# Patient Record
Sex: Male | Born: 1955 | Race: Black or African American | Hispanic: No | Smoking: Never smoker
Health system: Southern US, Community
[De-identification: ages and names within clinical notes are randomized; demographics above are authoritative.]

## PROBLEM LIST (undated history)

## (undated) DIAGNOSIS — Z972 Presence of dental prosthetic device (complete) (partial): Secondary | ICD-10-CM

## (undated) DIAGNOSIS — J45909 Unspecified asthma, uncomplicated: Secondary | ICD-10-CM

## (undated) DIAGNOSIS — I10 Essential (primary) hypertension: Secondary | ICD-10-CM

## (undated) HISTORY — PX: TRABECULECTOMY: SHX107

## (undated) HISTORY — PX: UPPER GI ENDOSCOPY: SHX6162

## (undated) HISTORY — DX: Essential (primary) hypertension: I10

---

## 2006-01-23 ENCOUNTER — Emergency Department: Payer: Self-pay | Admitting: Emergency Medicine

## 2006-01-23 ENCOUNTER — Other Ambulatory Visit: Payer: Self-pay

## 2012-05-10 HISTORY — PX: SHOULDER ARTHROSCOPY W/ ROTATOR CUFF REPAIR: SHX2400

## 2017-04-28 DIAGNOSIS — Z79899 Other long term (current) drug therapy: Secondary | ICD-10-CM | POA: Diagnosis not present

## 2017-04-28 DIAGNOSIS — R42 Dizziness and giddiness: Secondary | ICD-10-CM | POA: Diagnosis not present

## 2017-04-28 DIAGNOSIS — R471 Dysarthria and anarthria: Secondary | ICD-10-CM | POA: Diagnosis not present

## 2017-04-28 DIAGNOSIS — R55 Syncope and collapse: Secondary | ICD-10-CM | POA: Diagnosis not present

## 2017-04-28 DIAGNOSIS — R51 Headache: Secondary | ICD-10-CM | POA: Diagnosis not present

## 2017-04-28 DIAGNOSIS — R479 Unspecified speech disturbances: Secondary | ICD-10-CM | POA: Diagnosis not present

## 2017-04-28 DIAGNOSIS — R419 Unspecified symptoms and signs involving cognitive functions and awareness: Secondary | ICD-10-CM | POA: Diagnosis not present

## 2017-04-28 DIAGNOSIS — I10 Essential (primary) hypertension: Secondary | ICD-10-CM | POA: Diagnosis not present

## 2017-04-28 DIAGNOSIS — R079 Chest pain, unspecified: Secondary | ICD-10-CM | POA: Diagnosis not present

## 2017-04-28 DIAGNOSIS — G459 Transient cerebral ischemic attack, unspecified: Secondary | ICD-10-CM | POA: Diagnosis not present

## 2017-04-28 DIAGNOSIS — R4182 Altered mental status, unspecified: Secondary | ICD-10-CM | POA: Diagnosis not present

## 2017-04-28 DIAGNOSIS — R404 Transient alteration of awareness: Secondary | ICD-10-CM | POA: Diagnosis not present

## 2017-05-24 DIAGNOSIS — H40153 Residual stage of open-angle glaucoma, bilateral: Secondary | ICD-10-CM | POA: Diagnosis not present

## 2017-05-25 ENCOUNTER — Ambulatory Visit (INDEPENDENT_AMBULATORY_CARE_PROVIDER_SITE_OTHER): Payer: BLUE CROSS/BLUE SHIELD | Admitting: Family Medicine

## 2017-05-25 ENCOUNTER — Encounter: Payer: Self-pay | Admitting: Family Medicine

## 2017-05-25 ENCOUNTER — Other Ambulatory Visit: Payer: Self-pay

## 2017-05-25 VITALS — BP 140/82 | HR 89 | Resp 16 | Ht 68.0 in | Wt 189.4 lb

## 2017-05-25 DIAGNOSIS — R079 Chest pain, unspecified: Secondary | ICD-10-CM | POA: Diagnosis not present

## 2017-05-25 DIAGNOSIS — Z833 Family history of diabetes mellitus: Secondary | ICD-10-CM

## 2017-05-25 DIAGNOSIS — M4722 Other spondylosis with radiculopathy, cervical region: Secondary | ICD-10-CM

## 2017-05-25 DIAGNOSIS — I10 Essential (primary) hypertension: Secondary | ICD-10-CM | POA: Diagnosis not present

## 2017-05-25 DIAGNOSIS — K429 Umbilical hernia without obstruction or gangrene: Secondary | ICD-10-CM | POA: Insufficient documentation

## 2017-05-25 DIAGNOSIS — Z1211 Encounter for screening for malignant neoplasm of colon: Secondary | ICD-10-CM

## 2017-05-25 DIAGNOSIS — K439 Ventral hernia without obstruction or gangrene: Secondary | ICD-10-CM

## 2017-05-25 DIAGNOSIS — M5412 Radiculopathy, cervical region: Secondary | ICD-10-CM | POA: Insufficient documentation

## 2017-05-26 LAB — CBC
HEMATOCRIT: 47.6 % (ref 37.5–51.0)
Hemoglobin: 15.2 g/dL (ref 13.0–17.7)
MCH: 30.3 pg (ref 26.6–33.0)
MCHC: 31.9 g/dL (ref 31.5–35.7)
MCV: 95 fL (ref 79–97)
Platelets: 221 10*3/uL (ref 150–379)
RBC: 5.02 x10E6/uL (ref 4.14–5.80)
RDW: 12.2 % — AB (ref 12.3–15.4)
WBC: 5.7 10*3/uL (ref 3.4–10.8)

## 2017-05-26 LAB — COMPREHENSIVE METABOLIC PANEL
ALBUMIN: 4.6 g/dL (ref 3.6–4.8)
ALK PHOS: 77 IU/L (ref 39–117)
ALT: 24 IU/L (ref 0–44)
AST: 22 IU/L (ref 0–40)
Albumin/Globulin Ratio: 1.5 (ref 1.2–2.2)
BUN / CREAT RATIO: 15 (ref 10–24)
BUN: 21 mg/dL (ref 8–27)
Bilirubin Total: 0.5 mg/dL (ref 0.0–1.2)
CALCIUM: 9.4 mg/dL (ref 8.6–10.2)
CO2: 24 mmol/L (ref 20–29)
CREATININE: 1.36 mg/dL — AB (ref 0.76–1.27)
Chloride: 100 mmol/L (ref 96–106)
GFR calc Af Amer: 64 mL/min/{1.73_m2} (ref >59)
GFR, EST NON AFRICAN AMERICAN: 56 mL/min/{1.73_m2} — AB (ref >59)
GLUCOSE: 90 mg/dL (ref 65–99)
Globulin, Total: 3 g/dL (ref 1.5–4.5)
Potassium: 4.3 mmol/L (ref 3.5–5.2)
Sodium: 138 mmol/L (ref 134–144)
TOTAL PROTEIN: 7.6 g/dL (ref 6.0–8.5)

## 2017-05-26 LAB — LIPID PANEL
CHOL/HDL RATIO: 3.5 ratio (ref 0.0–5.0)
Cholesterol, Total: 149 mg/dL (ref 100–199)
HDL: 42 mg/dL (ref >39)
LDL Calculated: 85 mg/dL (ref 0–99)
TRIGLYCERIDES: 110 mg/dL (ref 0–149)
VLDL Cholesterol Cal: 22 mg/dL (ref 5–40)

## 2017-05-26 LAB — HEMOGLOBIN A1C
ESTIMATED AVERAGE GLUCOSE: 85 mg/dL
HEMOGLOBIN A1C: 4.6 % — AB (ref 4.8–5.6)

## 2017-05-26 LAB — TSH: TSH: 1.38 u[IU]/mL (ref 0.450–4.500)

## 2017-05-26 LAB — VITAMIN B12: Vitamin B-12: 597 pg/mL (ref 232–1245)

## 2017-05-26 NOTE — Progress Notes (Addendum)
Date:  05/25/2017   Name:  Jason [name]   DOB:  20-Nov-1955   MRN:  [id number]  PCP:  Schuyler Amor, MD    Chief Complaint: Establish Care   History of Present Illness:  This is a 62 y.o. male seen for initial visit. HTN on valsartan/HCTZ x yrs, went off recently and ended up in ED with elevated BP and MS changes, w/u negative, has felt ok since med restarted except does have intermittent exertional chest tightness. EKG in ED showed possible LVH and lateral ischemia. Also c/o RLQ abd pain with bending over or Valsalva past 2 years, getting slowly worse. Also c/o intermittent neck pain and BUE numbness, neck CT in ED showed multilevel cervical spondylosis. Denies hx surgery or hospitalization, taking asa qod for prevention. Father died prostate ca 67, mother alive 82 with IDDM, sister ok. Tet imm 3 yrs ago, declines flu imm, never had colonoscopy.  Review of Systems:  Review of Systems  Constitutional: Negative for chills and fever.  HENT: Negative for sinus pain and trouble swallowing.   Respiratory: Negative for cough and shortness of breath.   Cardiovascular: Negative for leg swelling.  Genitourinary: Negative for difficulty urinating and urgency.  Neurological: Negative for syncope and light-headedness.    Patient Active Problem List   Diagnosis Date Noted  . Hypertension 05/25/2017  . Ventral hernia 05/25/2017  . Exertional chest pain 05/25/2017  . Cervical radiculopathy 05/25/2017  . FH: diabetes mellitus 05/25/2017    Prior to Admission medications   Medication Sig Start Date End Date Taking? Authorizing Provider  aspirin 81 MG tablet Take 81 mg by mouth daily.   Yes [provider]  valsartan-hydrochlorothiazide (DIOVAN-HCT) 80-12.5 MG tablet  05/24/17  Yes [provider]    No Known Allergies  History reviewed. No pertinent surgical history.  Social History   Tobacco Use  . Smoking status: Never Smoker  . Smokeless tobacco: Never Used  Substance  Use Topics  . Alcohol use: No    Frequency: Never  . Drug use: No    Family History  Problem Relation Age of Onset  . Diabetes Mother   . Prostatitis Father   . Prostatitis Paternal Aunt     Medication list has been reviewed and updated.  Physical Examination: BP 140/82   Pulse 89   Resp 16   Ht 5\' 8"  (1.727 m)   Wt 189 lb 6.4 oz (85.9 kg)   SpO2 98%   BMI 28.80 kg/m   Physical Exam  Constitutional: He is oriented to person, place, and time. He appears well-developed and well-nourished.  HENT:  Head: Normocephalic and atraumatic.  Right Ear: External ear normal.  Left Ear: External ear normal.  Nose: Nose normal.  Mouth/Throat: Oropharynx is clear and moist.  Eyes: Conjunctivae and EOM are normal. Pupils are equal, round, and reactive to light.  Neck: Neck supple. No thyromegaly present.  Cardiovascular: Normal rate, regular rhythm and normal heart sounds.  Pulmonary/Chest: Effort normal and breath sounds normal.  Abdominal: Soft. He exhibits no distension.  Mildly tender bulge RLQ with Valsalva appreciated  Musculoskeletal: He exhibits no edema.  Lymphadenopathy:    He has no cervical adenopathy.  Neurological: He is alert and oriented to person, place, and time. Coordination normal.  Skin: Skin is warm and dry.  Psychiatric: He has a normal mood and affect. His behavior is normal.  Nursing note and vitals reviewed.   Assessment and Plan:  1. Essential hypertension Adequate control back on valsartan/HCTZ,  refill - Comprehensive Metabolic Panel (CMET) - CBC - TSH - Lipid Profile  2. Exertional chest pain With abnormal EKG in ED, add asa 81 mg daily - Ambulatory referral to Cardiology  3. Osteoarthritis of spine with radiculopathy, cervical region - B12 - Ambulatory referral to Physical Therapy  4. Ventral hernia without obstruction or gangrene Discussed possible surgical referral, will defer until other issues resolved  5. Colon cancer screening -  Ambulatory referral to Gastroenterology  6. FH: diabetes mellitus - HgB A1c  7. HM Consider zoster imm, PSA/HIV/hep C next visit  Return in about 4 weeks (around 06/22/2017).   45 mins spent with patient over half in counselint  Caris Cerveny M. Kingsley SpittlePlonk, Jr. MD [facility]  05/26/2017

## 2017-06-22 ENCOUNTER — Ambulatory Visit: Payer: BLUE CROSS/BLUE SHIELD | Admitting: Family Medicine

## 2017-06-24 ENCOUNTER — Other Ambulatory Visit: Payer: Self-pay

## 2017-08-04 ENCOUNTER — Other Ambulatory Visit: Payer: Self-pay

## 2017-08-04 MED ORDER — VALSARTAN-HYDROCHLOROTHIAZIDE 80-12.5 MG PO TABS: 1.0000 | ORAL_TABLET | Freq: Every day | ORAL | 3 refills | Status: DC

## 2017-09-02 ENCOUNTER — Encounter: Payer: Self-pay | Admitting: Family Medicine

## 2017-09-02 ENCOUNTER — Ambulatory Visit (INDEPENDENT_AMBULATORY_CARE_PROVIDER_SITE_OTHER): Payer: BLUE CROSS/BLUE SHIELD | Admitting: Family Medicine

## 2017-09-02 VITALS — BP 130/100 | HR 72 | Temp 98.7°F | Ht 68.0 in | Wt 187.0 lb

## 2017-09-02 DIAGNOSIS — J0100 Acute maxillary sinusitis, unspecified: Secondary | ICD-10-CM | POA: Diagnosis not present

## 2017-09-02 MED ORDER — AMOXICILLIN 500 MG PO CAPS: 500.0000 mg | ORAL_CAPSULE | Freq: Three times a day (TID) | ORAL | 0 refills | Status: DC

## 2017-09-02 NOTE — Progress Notes (Signed)
Name: Jason [name]   MRN: [id number]    DOB: 1955/05/13   Date:09/02/2017       Progress Note  Subjective  Chief Complaint  Chief Complaint  Patient presents with  . Sore Throat    worse sore throat he has ever had- got back from [place] on April 4th, cough with green production, bad breath, drainage    Sore Throat   This is a new problem. The current episode started 1 to 4 weeks ago (over 2 weeks). The problem has been gradually worsening. Neither side of throat is experiencing more pain than the other. There has been no fever. The fever has been present for 5 days or more. The pain is at a severity of 8/10. The pain is moderate. Associated symptoms include congestion, coughing, a hoarse voice, neck pain, shortness of breath and stridor. Pertinent negatives include no abdominal pain, diarrhea, drooling, ear discharge, ear pain, headaches, plugged ear sensation, swollen glands, trouble swallowing or vomiting. He has had no exposure to strep or mono. He has tried NSAIDs for the symptoms. The treatment provided mild relief.  Sinusitis  This is a new problem. The current episode started 1 to 4 weeks ago (2 weeks). The problem has been gradually improving since onset. Maximum temperature: low grade. Associated symptoms include congestion, coughing, diaphoresis, a hoarse voice, neck pain, shortness of breath, sinus pressure and a sore throat. Pertinent negatives include no chills, ear pain, headaches, sneezing or swollen glands. Past treatments include oral decongestants. The treatment provided no relief.    No problem-specific Assessment & Plan notes found for this encounter.   Past Medical History:  Diagnosis Date  . Hypertension     No past surgical history on file.  Family History  Problem Relation Age of Onset  . Diabetes Mother   . Prostatitis Father   . Prostatitis Paternal Aunt     Social History   Socioeconomic History  . Marital status: Widowed    Spouse name: Not on file   . Number of children: Not on file  . Years of education: Not on file  . Highest education level: Not on file  Occupational History  . Occupation: employed  Engineer, productionocial Needs  . Financial resource strain: Patient refused  . Food insecurity:    Worry: Patient refused    Inability: Patient refused  . Transportation needs:    Medical: Patient refused    Non-medical: Patient refused  Tobacco Use  . Smoking status: Never Smoker  . Smokeless tobacco: Never Used  Substance and Sexual Activity  . Alcohol use: No    Frequency: Never  . Drug use: No  . Sexual activity: Not on file  Lifestyle  . Physical activity:    Days per week: Patient refused    Minutes per session: Patient refused  . Stress: Not on file  Relationships  . Social connections:    Talks on phone: Patient refused    Gets together: Patient refused    Attends religious service: Patient refused    Active member of club or organization: Patient refused    Attends meetings of clubs or organizations: Patient refused    Relationship status: Patient refused  . Intimate partner violence:    Fear of current or ex partner: Patient refused    Emotionally abused: Patient refused    Physically abused: Patient refused    Forced sexual activity: Patient refused  Other Topics Concern  . Not on file  Social History Narrative  . Not on  file    No Known Allergies  Outpatient Medications Prior to Visit  Medication Sig Dispense Refill  . valsartan-hydrochlorothiazide (DIOVAN-HCT) 80-12.5 MG tablet Take 1 tablet by mouth daily. 90 tablet 3  . aspirin 81 MG tablet Take 81 mg by mouth daily.     No facility-administered medications prior to visit.     Review of Systems  Constitutional: Positive for diaphoresis. Negative for chills, fever, malaise/fatigue and weight loss.  HENT: Positive for congestion, hoarse voice, sinus pressure and sore throat. Negative for drooling, ear discharge, ear pain, sneezing and trouble swallowing.    Eyes: Negative for blurred vision.  Respiratory: Positive for cough, shortness of breath and stridor. Negative for sputum production and wheezing.   Cardiovascular: Negative for chest pain, palpitations and leg swelling.  Gastrointestinal: Negative for abdominal pain, blood in stool, constipation, diarrhea, heartburn, melena, nausea and vomiting.  Genitourinary: Negative for dysuria, frequency, hematuria and urgency.  Musculoskeletal: Positive for neck pain. Negative for back pain, joint pain and myalgias.  Skin: Negative for rash.  Neurological: Negative for dizziness, tingling, sensory change, focal weakness and headaches.  Endo/Heme/Allergies: Negative for environmental allergies and polydipsia. Does not bruise/bleed easily.  Psychiatric/Behavioral: Negative for depression and suicidal ideas. The patient is not nervous/anxious and does not have insomnia.      Objective  Vitals:   09/02/17 1427  BP: (!) 130/100  Pulse: 72  Temp: 98.7 F (37.1 C)  TempSrc: Oral  Weight: 187 lb (84.8 kg)  Height: 5\' 8"  (1.727 m)    Physical Exam  Constitutional: He is oriented to person, place, and time.  HENT:  Head: Normocephalic.  Right Ear: External ear normal.  Left Ear: External ear normal.  Nose: Nose normal.  Mouth/Throat: Oropharynx is clear and moist.  Eyes: Pupils are equal, round, and reactive to light. Conjunctivae and EOM are normal. Right eye exhibits no discharge. Left eye exhibits no discharge. No scleral icterus.  Neck: Normal range of motion. Neck supple. No JVD present. No tracheal deviation present. No thyromegaly present.  Cardiovascular: Normal rate, regular rhythm, normal heart sounds and intact distal pulses. Exam reveals no gallop and no friction rub.  No murmur heard. Pulmonary/Chest: Breath sounds normal. No respiratory distress. He has no wheezes. He has no rales.  Abdominal: Soft. Bowel sounds are normal. He exhibits no mass. There is no hepatosplenomegaly. There  is no tenderness. There is no rebound, no guarding and no CVA tenderness.  Musculoskeletal: Normal range of motion. He exhibits no edema or tenderness.  Lymphadenopathy:    He has no cervical adenopathy.  Neurological: He is alert and oriented to person, place, and time. He has normal strength and normal reflexes. No cranial nerve deficit.  Skin: Skin is warm. No rash noted.  Nursing note and vitals reviewed.     Assessment & Plan  Problem List Items Addressed This Visit    None    Visit Diagnoses    Acute maxillary sinusitis, recurrence not specified    -  Primary   call if cont sx /next step ent   Relevant Medications   amoxicillin (AMOXIL) 500 MG capsule      Meds ordered this encounter  Medications  . amoxicillin (AMOXIL) 500 MG capsule    Sig: Take 1 capsule (500 mg total) by mouth 3 (three) times daily.    Dispense:  30 capsule    Refill:  0      Dr. Elizabeth Sauer [facility]  09/02/17

## 2018-10-06 ENCOUNTER — Other Ambulatory Visit: Payer: Self-pay | Admitting: Family Medicine

## 2018-10-10 ENCOUNTER — Other Ambulatory Visit: Payer: Self-pay

## 2018-10-10 ENCOUNTER — Telehealth: Payer: Self-pay | Admitting: Family Medicine

## 2018-10-10 MED ORDER — VALSARTAN-HYDROCHLOROTHIAZIDE 80-12.5 MG PO TABS: 1.0000 | ORAL_TABLET | Freq: Every day | ORAL | 0 refills | Status: DC

## 2018-10-10 NOTE — Telephone Encounter (Signed)
Need refills til his appointment June 16.  valsartan-hydrochlorothiazide (DIOVAN-HCT) 80-12.5 MG tablet [[id number]]

## 2018-10-24 ENCOUNTER — Other Ambulatory Visit: Payer: Self-pay

## 2018-10-24 ENCOUNTER — Ambulatory Visit (INDEPENDENT_AMBULATORY_CARE_PROVIDER_SITE_OTHER): Payer: BC Managed Care – PPO | Admitting: Family Medicine

## 2018-10-24 ENCOUNTER — Encounter: Payer: Self-pay | Admitting: Family Medicine

## 2018-10-24 VITALS — BP 138/82 | HR 60 | Ht 68.0 in | Wt 179.0 lb

## 2018-10-24 DIAGNOSIS — K4090 Unilateral inguinal hernia, without obstruction or gangrene, not specified as recurrent: Secondary | ICD-10-CM | POA: Diagnosis not present

## 2018-10-24 DIAGNOSIS — E663 Overweight: Secondary | ICD-10-CM

## 2018-10-24 DIAGNOSIS — Z114 Encounter for screening for human immunodeficiency virus [HIV]: Secondary | ICD-10-CM

## 2018-10-24 DIAGNOSIS — Z1159 Encounter for screening for other viral diseases: Secondary | ICD-10-CM

## 2018-10-24 DIAGNOSIS — Z1211 Encounter for screening for malignant neoplasm of colon: Secondary | ICD-10-CM

## 2018-10-24 DIAGNOSIS — B1920 Unspecified viral hepatitis C without hepatic coma: Secondary | ICD-10-CM

## 2018-10-24 DIAGNOSIS — I10 Essential (primary) hypertension: Secondary | ICD-10-CM | POA: Diagnosis not present

## 2018-10-24 LAB — HEMOCCULT GUIAC POC 1CARD (OFFICE): Fecal Occult Blood, POC: NEGATIVE

## 2018-10-24 MED ORDER — VALSARTAN-HYDROCHLOROTHIAZIDE 80-12.5 MG PO TABS: 1.0000 | ORAL_TABLET | Freq: Every day | ORAL | 1 refills | Status: DC

## 2018-10-24 NOTE — Patient Instructions (Signed)

## 2018-10-24 NOTE — Progress Notes (Signed)
Date:  10/24/2018   Name:  Jason [name]   DOB:  Mar 08, 1956   MRN:  [id number]   Chief Complaint: Hypertension and Hernia (has "got bigger in the past 3 months")  Hypertension This is a new problem. The current episode started more than 1 year ago. The problem is unchanged. The problem is controlled. Pertinent negatives include no anxiety, blurred vision, chest pain, headaches, malaise/fatigue, neck pain, orthopnea, palpitations, peripheral edema, PND, shortness of breath or sweats. There are no associated agents to hypertension. There are no known risk factors for coronary artery disease. Past treatments include angiotensin blockers and diuretics. The current treatment provides no improvement. There are no compliance problems.  There is no history of angina, kidney disease, CAD/MI, CVA, heart failure, left ventricular hypertrophy, PVD or retinopathy. There is no history of chronic renal disease, a hypertension causing med or renovascular disease.  Other This is a new (for hernia) problem. The current episode started more than 1 month ago. The problem has been gradually worsening (getting bigger). Pertinent negatives include no abdominal pain, change in bowel habit, chest pain, chills, coughing, fever, headaches, myalgias, nausea, neck pain, rash or sore throat. Nothing aggravates the symptoms. He has tried nothing for the symptoms.    Review of Systems  Constitutional: Negative for chills, fever and malaise/fatigue.  HENT: Negative for drooling, ear discharge, ear pain and sore throat.   Eyes: Negative for blurred vision.  Respiratory: Negative for cough, choking, chest tightness, shortness of breath and wheezing.   Cardiovascular: Negative for chest pain, palpitations, orthopnea, leg swelling and PND.  Gastrointestinal: Negative for abdominal pain, blood in stool, change in bowel habit, constipation, diarrhea and nausea.  Endocrine: Negative for polydipsia.  Genitourinary: Negative for  dysuria, frequency, hematuria and urgency.  Musculoskeletal: Negative for back pain, myalgias and neck pain.  Skin: Negative for rash.  Allergic/Immunologic: Negative for environmental allergies.  Neurological: Negative for dizziness and headaches.  Hematological: Does not bruise/bleed easily.  Psychiatric/Behavioral: Negative for suicidal ideas. The patient is not nervous/anxious.     Patient Active Problem List   Diagnosis Date Noted  . Hypertension 05/25/2017  . Ventral hernia 05/25/2017  . Exertional chest pain 05/25/2017  . Cervical radiculopathy 05/25/2017  . FH: diabetes mellitus 05/25/2017    No Known Allergies  No past surgical history on file.  Social History   Tobacco Use  . Smoking status: Never Smoker  . Smokeless tobacco: Never Used  Substance Use Topics  . Alcohol use: No    Frequency: Never  . Drug use: No     Medication list has been reviewed and updated.  Current Meds  Medication Sig  . valsartan-hydrochlorothiazide (DIOVAN-HCT) 80-12.5 MG tablet Take 1 tablet by mouth daily.    PHQ 2/9 Scores 10/24/2018 05/25/2017  PHQ - 2 Score 0 0  PHQ- 9 Score 0 -    BP Readings from Last 3 Encounters:  10/24/18 138/82  09/02/17 (!) 130/100  05/25/17 140/82    Physical Exam Vitals signs and nursing note reviewed.  HENT:     Head: Normocephalic.     Right Ear: External ear normal.     Left Ear: External ear normal.     Nose: Nose normal.  Eyes:     General: No scleral icterus.       Right eye: No discharge.        Left eye: No discharge.     Conjunctiva/sclera: Conjunctivae normal.     Pupils: Pupils are equal, round,  and reactive to light.  Neck:     Musculoskeletal: Normal range of motion and neck supple.     Thyroid: No thyromegaly.     Vascular: No JVD.     Trachea: No tracheal deviation.  Cardiovascular:     Rate and Rhythm: Normal rate and regular rhythm.     Pulses: Normal pulses.          Carotid pulses are 2+ on the right side and 2+  on the left side.      Radial pulses are 2+ on the right side and 2+ on the left side.       Femoral pulses are 2+ on the right side and 2+ on the left side.      Popliteal pulses are 2+ on the right side and 2+ on the left side.       Dorsalis pedis pulses are 2+ on the right side and 2+ on the left side.       Posterior tibial pulses are 2+ on the right side and 2+ on the left side.     Heart sounds: Normal heart sounds, S1 normal and S2 normal. No murmur. No systolic murmur. No diastolic murmur. No friction rub. No gallop. No S3 or S4 sounds.   Pulmonary:     Effort: No respiratory distress.     Breath sounds: Normal breath sounds. No stridor. No wheezing, rhonchi or rales.  Abdominal:     General: Bowel sounds are normal.     Palpations: Abdomen is soft. There is no mass.     Tenderness: There is no abdominal tenderness. There is no guarding or rebound.     Hernia: A hernia is present. Hernia is present in the left inguinal area. There is no hernia in the right inguinal area.  Genitourinary:    Scrotum/Testes: Normal.     Prostate: Normal.     Rectum: Normal. Guaiac result negative.  Musculoskeletal: Normal range of motion.        General: No tenderness.     Right lower leg: No edema.     Left lower leg: No edema.  Lymphadenopathy:     Cervical: No cervical adenopathy.     Lower Body: No right inguinal adenopathy. No left inguinal adenopathy.  Skin:    General: Skin is warm.     Findings: No rash.  Neurological:     Mental Status: He is alert and oriented to person, place, and time.     Cranial Nerves: No cranial nerve deficit.     Deep Tendon Reflexes: Reflexes are normal and symmetric.     Wt Readings from Last 3 Encounters:  10/24/18 179 lb (81.2 kg)  09/02/17 187 lb (84.8 kg)  05/25/17 189 lb 6.4 oz (85.9 kg)    BP 138/82   Pulse 60   Ht 5\' 8"  (1.727 m)   Wt 179 lb (81.2 kg)   BMI 27.22 kg/m   Assessment and Plan:  1. Essential hypertension Chronic.   Controlled.  Continue valsartan hydrochlorothiazide 80-12 0.5 once a day will check renal function panel and recheck in 6 months. - valsartan-hydrochlorothiazide (DIOVAN-HCT) 80-12.5 MG tablet; Take 1 tablet by mouth daily.  Dispense: 90 tablet; Refill: 1 - Renal Function Panel  2. Non-recurrent unilateral inguinal hernia without obstruction or gangrene New onset over the past 6 weeks of a left inguinal hernia that is reducible.  At this point in time patient would like to have it evaluated because it is beginning to  affect his activity level.  Will refer to general surgery for evaluation. - Ambulatory referral to General Surgery  3. Encounter for screening for HIV Screen for HIV. - HIV Antibody (routine testing w rflx)  4. Need for hepatitis C screening test Screen for hep C screen. - Hepatitis C antibody  5. Colon cancer screening We will screen for colon cancer with guaiac that was negative patient does need to be evaluated for gastroenterology for colonoscopy. - Ambulatory referral to Gastroenterology - POCT Occult Blood Stool  6. Over weight Health risks of being over weight were discussed and patient was counseled on weight loss options and exercise.  7. Hepatitis C virus infection without hepatic coma, unspecified chronicity Patient has possible history of hepatitis C infection.  Will check his hep C given his history and if positive we will refer to infectious disease.

## 2018-10-25 LAB — RENAL FUNCTION PANEL
Albumin: 4.7 g/dL (ref 3.8–4.8)
BUN/Creatinine Ratio: 16 (ref 10–24)
BUN: 15 mg/dL (ref 8–27)
CO2: 25 mmol/L (ref 20–29)
Calcium: 9.4 mg/dL (ref 8.6–10.2)
Chloride: 99 mmol/L (ref 96–106)
Creatinine, Ser: 0.95 mg/dL (ref 0.76–1.27)
GFR calc Af Amer: 98 mL/min/{1.73_m2} (ref >59)
GFR calc non Af Amer: 85 mL/min/{1.73_m2} (ref >59)
Glucose: 96 mg/dL (ref 65–99)
Phosphorus: 3.2 mg/dL (ref 2.8–4.1)
Potassium: 4.1 mmol/L (ref 3.5–5.2)
Sodium: 138 mmol/L (ref 134–144)

## 2018-10-25 LAB — HIV ANTIBODY (ROUTINE TESTING W REFLEX): HIV Screen 4th Generation wRfx: NONREACTIVE

## 2018-10-25 LAB — HEPATITIS C ANTIBODY: Hep C Virus Ab: 0.1 s/co ratio (ref 0.0–0.9)

## 2018-11-01 ENCOUNTER — Ambulatory Visit: Payer: Self-pay | Admitting: General Surgery

## 2018-11-06 ENCOUNTER — Encounter: Payer: Self-pay | Admitting: *Deleted

## 2018-11-09 ENCOUNTER — Telehealth: Payer: Self-pay

## 2018-11-09 NOTE — Telephone Encounter (Signed)
LVM for pt to call office to schedule colonoscopy.  Thanks Annalina Needles 

## 2018-12-04 ENCOUNTER — Encounter: Payer: Self-pay | Admitting: *Deleted

## 2019-01-23 ENCOUNTER — Encounter: Payer: Self-pay | Admitting: *Deleted

## 2019-04-24 ENCOUNTER — Other Ambulatory Visit: Payer: Self-pay | Admitting: Family Medicine

## 2019-04-24 DIAGNOSIS — I10 Essential (primary) hypertension: Secondary | ICD-10-CM

## 2019-04-26 ENCOUNTER — Encounter: Payer: Self-pay | Admitting: Family Medicine

## 2019-04-26 ENCOUNTER — Ambulatory Visit (INDEPENDENT_AMBULATORY_CARE_PROVIDER_SITE_OTHER): Payer: BC Managed Care – PPO | Admitting: Family Medicine

## 2019-04-26 ENCOUNTER — Other Ambulatory Visit: Payer: Self-pay

## 2019-04-26 VITALS — BP 150/82 | HR 80 | Ht 69.0 in | Wt 183.0 lb

## 2019-04-26 DIAGNOSIS — R079 Chest pain, unspecified: Secondary | ICD-10-CM | POA: Diagnosis not present

## 2019-04-26 DIAGNOSIS — I10 Essential (primary) hypertension: Secondary | ICD-10-CM

## 2019-04-26 MED ORDER — VALSARTAN-HYDROCHLOROTHIAZIDE 160-25 MG PO TABS: 1.0000 | ORAL_TABLET | Freq: Every day | ORAL | 0 refills | Status: DC

## 2019-04-26 NOTE — Progress Notes (Signed)
Date:  04/26/2019   Name:  Jason [name]   DOB:  09-25-1955   MRN:  [id number]   Chief Complaint: Hypertension  Hypertension This is a chronic problem. The current episode started more than 1 year ago. The problem has been waxing and waning since onset. The problem is uncontrolled. Pertinent negatives include no anxiety, blurred vision, chest pain, headaches, malaise/fatigue, neck pain, orthopnea, palpitations, peripheral edema, PND, shortness of breath or sweats. There are no associated agents to hypertension. There are no known risk factors for coronary artery disease. Past treatments include ACE inhibitors and diuretics. The current treatment provides moderate improvement. Compliance problems include medication side effects.  There is no history of angina, kidney disease, CAD/MI, CVA, heart failure, left ventricular hypertrophy, PVD or retinopathy. There is no history of chronic renal disease, a hypertension causing med or renovascular disease.  Chest Pain  This is a new problem. The current episode started more than 1 month ago. The onset quality is gradual. The problem occurs daily. The problem has been waxing and waning. The pain is present in the substernal region. The pain is mild. The quality of the pain is described as tightness. The pain does not radiate. Pertinent negatives include no abdominal pain, back pain, claudication, cough, diaphoresis, dizziness, exertional chest pressure, fever, headaches, hemoptysis, irregular heartbeat, leg pain, lower extremity edema, malaise/fatigue, nausea, near-syncope, numbness, orthopnea, palpitations, PND, shortness of breath, sputum production, syncope, vomiting or weakness.  His past medical history is significant for hypertension.  Pertinent negatives for past medical history include no PVD.    Lab Results  Component Value Date   CREATININE 0.95 10/24/2018   BUN 15 10/24/2018   NA 138 10/24/2018   K 4.1 10/24/2018   CL 99 10/24/2018   CO2 25  10/24/2018   Lab Results  Component Value Date   CHOL 149 05/25/2017   HDL 42 05/25/2017   LDLCALC 85 05/25/2017   TRIG 110 05/25/2017   CHOLHDL 3.5 05/25/2017   Lab Results  Component Value Date   TSH 1.380 05/25/2017   Lab Results  Component Value Date   HGBA1C 4.6 (L) 05/25/2017     Review of Systems  Constitutional: Negative for chills, diaphoresis, fever and malaise/fatigue.  HENT: Negative for drooling, ear discharge, ear pain and sore throat.   Eyes: Negative for blurred vision.  Respiratory: Negative for cough, hemoptysis, sputum production, shortness of breath and wheezing.   Cardiovascular: Negative for chest pain, palpitations, orthopnea, claudication, leg swelling, syncope, PND and near-syncope.  Gastrointestinal: Negative for abdominal pain, blood in stool, constipation, diarrhea, nausea and vomiting.  Endocrine: Negative for polydipsia.  Genitourinary: Negative for dysuria, frequency, hematuria and urgency.  Musculoskeletal: Negative for back pain, myalgias and neck pain.  Skin: Negative for rash.  Allergic/Immunologic: Negative for environmental allergies.  Neurological: Negative for dizziness, weakness, numbness and headaches.  Hematological: Does not bruise/bleed easily.  Psychiatric/Behavioral: Negative for suicidal ideas. The patient is not nervous/anxious.     Patient Active Problem List   Diagnosis Date Noted  . Hypertension 05/25/2017  . Ventral hernia 05/25/2017  . Exertional chest pain 05/25/2017  . Cervical radiculopathy 05/25/2017  . FH: diabetes mellitus 05/25/2017    No Known Allergies  No past surgical history on file.  Social History   Tobacco Use  . Smoking status: Never Smoker  . Smokeless tobacco: Never Used  Substance Use Topics  . Alcohol use: No  . Drug use: No     Medication list has been  reviewed and updated.  Current Meds  Medication Sig  . valsartan-hydrochlorothiazide (DIOVAN-HCT) 80-12.5 MG tablet TAKE 1 TABLET  BY MOUTH EVERY DAY    PHQ 2/9 Scores 10/24/2018 05/25/2017  PHQ - 2 Score 0 0  PHQ- 9 Score 0 -    BP Readings from Last 3 Encounters:  04/26/19 (!) 150/100  10/24/18 138/82  09/02/17 (!) 130/100    Physical Exam Vitals and nursing note reviewed.  HENT:     Head: Normocephalic.     Right Ear: Tympanic membrane, ear canal and external ear normal.     Left Ear: Tympanic membrane, ear canal and external ear normal.     Nose: Nose normal. No congestion or rhinorrhea.  Eyes:     General: No scleral icterus.       Right eye: No discharge.        Left eye: No discharge.     Conjunctiva/sclera: Conjunctivae normal.     Pupils: Pupils are equal, round, and reactive to light.  Neck:     Thyroid: No thyromegaly.     Vascular: No JVD.     Trachea: No tracheal deviation.  Cardiovascular:     Rate and Rhythm: Normal rate and regular rhythm.     Heart sounds: Normal heart sounds. No murmur. No friction rub. No gallop.   Pulmonary:     Effort: No respiratory distress.     Breath sounds: Normal breath sounds. No stridor. No wheezing, rhonchi or rales.  Chest:     Chest wall: No tenderness.  Abdominal:     General: Bowel sounds are normal.     Palpations: Abdomen is soft. There is no mass.     Tenderness: There is no abdominal tenderness. There is no guarding or rebound.  Musculoskeletal:        General: No tenderness. Normal range of motion.     Cervical back: Normal range of motion and neck supple.  Lymphadenopathy:     Cervical: No cervical adenopathy.  Skin:    General: Skin is warm.     Findings: No rash.  Neurological:     Mental Status: He is alert and oriented to person, place, and time.     Cranial Nerves: No cranial nerve deficit.     Deep Tendon Reflexes: Reflexes are normal and symmetric.     Wt Readings from Last 3 Encounters:  04/26/19 183 lb (83 kg)  10/24/18 179 lb (81.2 kg)  09/02/17 187 lb (84.8 kg)    BP (!) 150/100   Pulse 80   Ht 5\' 9"  (1.753 m)    Wt 183 lb (83 kg)   BMI 27.02 kg/m   Assessment and Plan:  1. Essential hypertension Chronic.  Uncontrolled.  Will increase valsartan hydrochlorothiazide to 160-25 mg once a day will recheck in 6 weeks. - valsartan-hydrochlorothiazide (DIOVAN-HCT) 160-25 MG tablet; Take 1 tablet by mouth daily.  Dispense: 90 tablet; Refill: 0  2. Exertional chest pain Chronic.  Noted in 2007.  Patient continues to have substernal chest tightness with exertion or when he feels her palpitations.  We will obtain an EKG.I have reviewed EKG which shows sinus rhythm.  Intervals normal voltage criteria suggest LVH.  And patient also has nonspecific T wave changes in V5 and V6 as well as 3.  . Comparison to previous EKG dated 2007 and it is unchanged.  Patient is referred to cardiology for evaluation and possible suggestions for hypertension. - EKG 12-Lead - Ambulatory referral to Cardiology

## 2019-05-21 DIAGNOSIS — U071 COVID-19: Secondary | ICD-10-CM | POA: Diagnosis not present

## 2019-05-21 DIAGNOSIS — R0602 Shortness of breath: Secondary | ICD-10-CM | POA: Diagnosis not present

## 2019-05-21 DIAGNOSIS — R05 Cough: Secondary | ICD-10-CM | POA: Diagnosis not present

## 2019-05-21 DIAGNOSIS — Z20828 Contact with and (suspected) exposure to other viral communicable diseases: Secondary | ICD-10-CM | POA: Diagnosis not present

## 2019-06-07 ENCOUNTER — Ambulatory Visit: Payer: BC Managed Care – PPO | Admitting: Family Medicine

## 2019-07-21 ENCOUNTER — Other Ambulatory Visit: Payer: Self-pay | Admitting: Family Medicine

## 2019-07-21 DIAGNOSIS — I10 Essential (primary) hypertension: Secondary | ICD-10-CM

## 2019-08-23 ENCOUNTER — Ambulatory Visit: Payer: Self-pay | Admitting: Family Medicine

## 2019-10-21 ENCOUNTER — Other Ambulatory Visit: Payer: Self-pay | Admitting: Family Medicine

## 2019-10-21 DIAGNOSIS — I10 Essential (primary) hypertension: Secondary | ICD-10-CM

## 2019-10-22 NOTE — Telephone Encounter (Signed)
Requested Prescriptions  Pending Prescriptions Disp Refills  . valsartan-hydrochlorothiazide (DIOVAN-HCT) 160-25 MG tablet [Pharmacy Med Name: VALSARTAN-HCTZ 160-25 MG TAB] 90 tablet 0    Sig: TAKE 1 TABLET BY MOUTH EVERY DAY     Cardiovascular: ARB + Diuretic Combos Failed - 10/21/2019  9:42 AM      Failed - K in normal range and within 180 days    Potassium  Date Value Ref Range Status  10/24/2018 4.1 3.5 - 5.2 mmol/L Final         Failed - Na in normal range and within 180 days    Sodium  Date Value Ref Range Status  10/24/2018 138 134 - 144 mmol/L Final         Failed - Cr in normal range and within 180 days    Creatinine, Ser  Date Value Ref Range Status  10/24/2018 0.95 0.76 - 1.27 mg/dL Final         Failed - Ca in normal range and within 180 days    Calcium  Date Value Ref Range Status  10/24/2018 9.4 8.6 - 10.2 mg/dL Final         Failed - Last BP in normal range    BP Readings from Last 1 Encounters:  04/26/19 (!) 150/82         Passed - Patient is not pregnant      Passed - Valid encounter within last 6 months    Recent Outpatient Visits          5 months ago Essential hypertension   [facility] Duanne Limerick, MD   12 months ago Essential hypertension   [facility] Duanne Limerick, MD   2 years ago Acute maxillary sinusitis, recurrence not specified   [facility] Duanne Limerick, MD   2 years ago Essential hypertension   [facility] Schuyler Amor, MD

## 2020-01-20 ENCOUNTER — Other Ambulatory Visit: Payer: Self-pay | Admitting: Family Medicine

## 2020-01-20 DIAGNOSIS — I10 Essential (primary) hypertension: Secondary | ICD-10-CM

## 2020-01-20 NOTE — Telephone Encounter (Signed)
Called pt and LM on Vm to call back to schedule appt. Requested Prescriptions  Pending Prescriptions Disp Refills   valsartan-hydrochlorothiazide (DIOVAN-HCT) 160-25 MG tablet [Pharmacy Med Name: VALSARTAN-HCTZ 160-25 MG TAB] 30 tablet 0    Sig: TAKE 1 TABLET BY MOUTH EVERY DAY     Cardiovascular: ARB + Diuretic Combos Failed - 01/20/2020  9:10 AM      Failed - K in normal range and within 180 days    Potassium  Date Value Ref Range Status  10/24/2018 4.1 3.5 - 5.2 mmol/L Final         Failed - Na in normal range and within 180 days    Sodium  Date Value Ref Range Status  10/24/2018 138 134 - 144 mmol/L Final         Failed - Cr in normal range and within 180 days    Creatinine, Ser  Date Value Ref Range Status  10/24/2018 0.95 0.76 - 1.27 mg/dL Final         Failed - Ca in normal range and within 180 days    Calcium  Date Value Ref Range Status  10/24/2018 9.4 8.6 - 10.2 mg/dL Final         Failed - Last BP in normal range    BP Readings from Last 1 Encounters:  04/26/19 (!) 150/82         Failed - Valid encounter within last 6 months    Recent Outpatient Visits          8 months ago Essential hypertension   [facility] Duanne Limerick, MD   1 year ago Essential hypertension   [facility] Duanne Limerick, MD   2 years ago Acute maxillary sinusitis, recurrence not specified   [facility] Duanne Limerick, MD   2 years ago Essential hypertension   [facility] Schuyler Amor, MD             Passed - Patient is not pregnant

## 2020-02-12 ENCOUNTER — Other Ambulatory Visit: Payer: Self-pay | Admitting: Family Medicine

## 2020-02-12 DIAGNOSIS — I10 Essential (primary) hypertension: Secondary | ICD-10-CM

## 2020-05-21 ENCOUNTER — Telehealth: Payer: Self-pay

## 2020-05-22 NOTE — Telephone Encounter (Signed)
I have called pt this morning and left a message concerned for the chest pain needing to go ahead and get evaluated. As well as, he is suppose to be taking a bp med- it appears he has not had this med since October of 2021/ If he returns call- chest pain has got to be evaluated ASAP, do not wait around

## 2020-05-23 ENCOUNTER — Ambulatory Visit (INDEPENDENT_AMBULATORY_CARE_PROVIDER_SITE_OTHER): Payer: BC Managed Care – PPO | Admitting: Family Medicine

## 2020-05-23 ENCOUNTER — Ambulatory Visit
Admission: RE | Admit: 2020-05-23 | Discharge: 2020-05-23 | Disposition: A | Discharge status: Home / Self Care | Payer: BC Managed Care – PPO | Attending: Family Medicine | Admitting: Family Medicine

## 2020-05-23 ENCOUNTER — Ambulatory Visit
Admission: RE | Admit: 2020-05-23 | Discharge: 2020-05-23 | Disposition: A | Discharge status: Home / Self Care | Payer: BC Managed Care – PPO | Source: Ambulatory Visit | Attending: Family Medicine | Admitting: Family Medicine

## 2020-05-23 ENCOUNTER — Encounter: Payer: Self-pay | Admitting: Family Medicine

## 2020-05-23 ENCOUNTER — Other Ambulatory Visit: Payer: Self-pay

## 2020-05-23 VITALS — BP 140/100 | HR 64 | Ht 69.0 in | Wt 181.0 lb

## 2020-05-23 DIAGNOSIS — N411 Chronic prostatitis: Secondary | ICD-10-CM

## 2020-05-23 DIAGNOSIS — K429 Umbilical hernia without obstruction or gangrene: Secondary | ICD-10-CM

## 2020-05-23 DIAGNOSIS — R0789 Other chest pain: Secondary | ICD-10-CM | POA: Diagnosis not present

## 2020-05-23 DIAGNOSIS — R3911 Hesitancy of micturition: Secondary | ICD-10-CM

## 2020-05-23 DIAGNOSIS — N401 Enlarged prostate with lower urinary tract symptoms: Secondary | ICD-10-CM

## 2020-05-23 DIAGNOSIS — I10 Essential (primary) hypertension: Secondary | ICD-10-CM

## 2020-05-23 LAB — POCT URINALYSIS DIPSTICK
Bilirubin, UA: 0.2
Blood, UA: POSITIVE
Glucose, UA: NEGATIVE
Ketones, UA: NEGATIVE
Leukocytes, UA: NEGATIVE
Nitrite, UA: NEGATIVE
Protein, UA: NEGATIVE
Spec Grav, UA: 1.01 (ref 1.010–1.025)
Urobilinogen, UA: 0.2 E.U./dL
pH, UA: 6 (ref 5.0–8.0)

## 2020-05-23 MED ORDER — DOXAZOSIN MESYLATE 1 MG PO TABS: 1.0000 mg | ORAL_TABLET | Freq: Every day | ORAL | 1 refills | Status: DC

## 2020-05-23 MED ORDER — DOXYCYCLINE HYCLATE 100 MG PO TABS: 100.0000 mg | ORAL_TABLET | Freq: Two times a day (BID) | ORAL | 0 refills | Status: DC

## 2020-05-23 MED ORDER — VALSARTAN-HYDROCHLOROTHIAZIDE 160-25 MG PO TABS: 1.0000 | ORAL_TABLET | Freq: Every day | ORAL | 1 refills | Status: DC

## 2020-05-23 NOTE — Progress Notes (Signed)
Date:  05/23/2020   Name:  Jason [name]   DOB:  January 29, 1956   MRN:  [id number]   Chief Complaint: Hypertension, Back Pain (Having problems starting stream), and Hernia (Comes and goes- sometimes bending over he feels pressure/ some pain from it)  Hypertension This is a chronic problem. The current episode started more than 1 year ago. The problem has been waxing and waning since onset. The problem is controlled. Pertinent negatives include no anxiety, blurred vision, chest pain, headaches, malaise/fatigue, neck pain, orthopnea, palpitations, peripheral edema, PND, shortness of breath or sweats. There are no associated agents to hypertension. There are no compliance problems.  There is no history of angina, kidney disease, CAD/MI, CVA, heart failure, left ventricular hypertrophy, PVD or retinopathy.  Back Pain This is a chronic problem. The current episode started more than 1 year ago. The problem has been waxing and waning since onset. The pain is present in the lumbar spine. The quality of the pain is described as aching. The pain is moderate. The symptoms are aggravated by bending. Pertinent negatives include no abdominal pain, bladder incontinence, bowel incontinence, chest pain, dysuria, fever, headaches, numbness, paresis, paresthesias or pelvic pain.  Male GU Problem The patient's pertinent negatives include no genital injury, genital itching, genital lesions, pelvic pain, penile discharge, penile pain, priapism, scrotal swelling or testicular pain. This is a new problem. The current episode started more than 1 month ago. The problem occurs daily. The problem has been gradually worsening. Associated symptoms include hesitancy. Pertinent negatives include no abdominal pain, chest pain, chills, constipation, coughing, diarrhea, dysuria, fever, frequency, headaches, nausea, rash, shortness of breath, sore throat or urgency.    Lab Results  Component Value Date   CREATININE 0.95 10/24/2018    BUN 15 10/24/2018   NA 138 10/24/2018   K 4.1 10/24/2018   CL 99 10/24/2018   CO2 25 10/24/2018   Lab Results  Component Value Date   CHOL 149 05/25/2017   HDL 42 05/25/2017   LDLCALC 85 05/25/2017   TRIG 110 05/25/2017   CHOLHDL 3.5 05/25/2017   Lab Results  Component Value Date   TSH 1.380 05/25/2017   Lab Results  Component Value Date   HGBA1C 4.6 (L) 05/25/2017   Lab Results  Component Value Date   WBC 5.7 05/25/2017   HGB 15.2 05/25/2017   HCT 47.6 05/25/2017   MCV 95 05/25/2017   PLT 221 05/25/2017   Lab Results  Component Value Date   ALT 24 05/25/2017   AST 22 05/25/2017   ALKPHOS 77 05/25/2017   BILITOT 0.5 05/25/2017     Review of Systems  Constitutional: Negative for chills, fever and malaise/fatigue.  HENT: Negative for drooling, ear discharge, ear pain and sore throat.   Eyes: Negative for blurred vision.  Respiratory: Negative for cough, shortness of breath and wheezing.   Cardiovascular: Negative for chest pain, palpitations, orthopnea, leg swelling and PND.  Gastrointestinal: Negative for abdominal pain, blood in stool, bowel incontinence, constipation, diarrhea and nausea.  Endocrine: Negative for polydipsia.  Genitourinary: Positive for hesitancy. Negative for bladder incontinence, dysuria, frequency, hematuria, pelvic pain, penile discharge, penile pain, scrotal swelling, testicular pain and urgency.  Musculoskeletal: Positive for back pain. Negative for myalgias and neck pain.  Skin: Negative for rash.  Allergic/Immunologic: Negative for environmental allergies.  Neurological: Negative for dizziness, numbness, headaches and paresthesias.  Hematological: Does not bruise/bleed easily.  Psychiatric/Behavioral: Negative for suicidal ideas. The patient is not nervous/anxious.  Patient Active Problem List   Diagnosis Date Noted  . Hypertension 05/25/2017  . Ventral hernia 05/25/2017  . Exertional chest pain 05/25/2017  . Cervical  radiculopathy 05/25/2017  . FH: diabetes mellitus 05/25/2017    No Known Allergies  No past surgical history on file.  Social History   Tobacco Use  . Smoking status: Never Smoker  . Smokeless tobacco: Never Used  Vaping Use  . Vaping Use: Never used  Substance Use Topics  . Alcohol use: No  . Drug use: No     Medication list has been reviewed and updated.  Current Meds  Medication Sig  . valsartan-hydrochlorothiazide (DIOVAN-HCT) 160-25 MG tablet TAKE 1 TABLET BY MOUTH EVERY DAY    PHQ 2/9 Scores 05/23/2020 10/24/2018 05/25/2017  PHQ - 2 Score 0 0 0  PHQ- 9 Score - 0 -    No flowsheet data found.  BP Readings from Last 3 Encounters:  05/23/20 (!) 140/100  04/26/19 (!) 150/82  10/24/18 138/82    Physical Exam Vitals and nursing note reviewed.  HENT:     Head: Normocephalic.     Right Ear: Tympanic membrane, ear canal and external ear normal.     Left Ear: Tympanic membrane, ear canal and external ear normal.     Nose: Nose normal.     Mouth/Throat:     Mouth: Oropharynx is clear and moist.  Eyes:     General: No scleral icterus.       Right eye: No discharge.        Left eye: No discharge.     Extraocular Movements: EOM normal.     Conjunctiva/sclera: Conjunctivae normal.     Pupils: Pupils are equal, round, and reactive to light.  Neck:     Thyroid: No thyromegaly.     Vascular: No JVD.     Trachea: No tracheal deviation.  Cardiovascular:     Rate and Rhythm: Normal rate and regular rhythm.     Pulses: Intact distal pulses.     Heart sounds: Normal heart sounds, S1 normal and S2 normal. No murmur heard.  No systolic murmur is present.  No diastolic murmur is present. No friction rub. No gallop. No S3 or S4 sounds.   Pulmonary:     Effort: No respiratory distress.     Breath sounds: Normal breath sounds. No decreased breath sounds, wheezing, rhonchi or rales.  Chest:     Chest wall: Tenderness present.  Breasts:     Right: Normal.     Left:  Normal.      Comments: Tender right posterior lateral chest wall Abdominal:     General: Bowel sounds are normal.     Palpations: Abdomen is soft. There is no hepatomegaly, splenomegaly, hepatosplenomegaly or mass.     Tenderness: There is no abdominal tenderness. There is no CVA tenderness, guarding or rebound.     Hernia: A hernia is present. Hernia is present in the umbilical area.  Genitourinary:    Prostate: Normal. Not enlarged, not tender and no nodules present.     Rectum: Normal. Guaiac result negative. No mass.  Musculoskeletal:        General: No tenderness or edema. Normal range of motion.     Cervical back: Normal range of motion and neck supple.  Lymphadenopathy:     Cervical: No cervical adenopathy.  Skin:    General: Skin is warm.     Findings: No rash.  Neurological:     Mental Status: He  is alert and oriented to person, place, and time.     Cranial Nerves: No cranial nerve deficit.     Deep Tendon Reflexes: Strength normal and reflexes are normal and symmetric.     Wt Readings from Last 3 Encounters:  05/23/20 181 lb (82.1 kg)  04/26/19 183 lb (83 kg)  10/24/18 179 lb (81.2 kg)    BP (!) 140/100   Pulse 64   Ht 5\' 9"  (1.753 m)   Wt 181 lb (82.1 kg)   BMI 26.73 kg/m   Assessment and Plan:  1. Chest wall pain Chronic.  Persistent.  Patient has a sensation of discomfort as if there is something in the chest for the past several months.  Patient was assured that the lungs sound fine is unlikely to be within the lungs there is very persistent of his concern and we will get a chest x-ray at this time. - DG Chest 2 View; Future  2. Essential hypertension Chronic.  Controlled.  Stable.  Patient has elevated blood pressure readings and he is always elevated and he is always saying he has forgotten to take his medication today we will add Cardura 1 mg once a day along with his valsartan and hydrochlorothiazide we will recheck blood pressure in 4 weeks. -  doxazosin (CARDURA) 1 MG tablet; Take 1 tablet (1 mg total) by mouth daily.  Dispense: 90 tablet; Refill: 1 - valsartan-hydrochlorothiazide (DIOVAN-HCT) 160-25 MG tablet; Take 1 tablet by mouth daily.  Dispense: 90 tablet; Refill: 1  3. Subacute prostatitis Prostate is normal size shape and consistency but he has had some hesitancy.  We will start him on doxycycline 100 mg twice a day for 10 days for subacute prostatitis. - doxycycline (VIBRA-TABS) 100 MG tablet; Take 1 tablet (100 mg total) by mouth 2 (two) times daily.  Dispense: 20 tablet; Refill: 0 - POCT urinalysis dipstick  4. Benign prostatic hyperplasia with urinary hesitancy As noted above patient has had some hesitancy with slow stream.  We will initiate Cardura 1 mg for the COVID reason of not only blood pressure control but also to improve his urination. - doxazosin (CARDURA) 1 MG tablet; Take 1 tablet (1 mg total) by mouth daily.  Dispense: 90 tablet; Refill: 1 - POCT urinalysis dipstick  5. Umbilical hernia without obstruction and without gangrene New onset.  Persistent.  But gradually increasing in size.  This is reducible without gangrene or difficulty with pain.  Patient is concerned that it is gradually increased in size and would like it taken care of at a future time.  I have reassured patient that this is not going to be an issue at this time and that it may have to wait until after the COVID pandemic for surgical repair. - Ambulatory referral to General Surgery

## 2020-05-30 ENCOUNTER — Ambulatory Visit: Payer: Self-pay

## 2020-05-30 NOTE — Telephone Encounter (Signed)
Patient called and says he started on doxazosin and doxycycline at his last visit on 05/23/20. He says he picked them both up on Tuesday, 05/28/19, started taking them on Wednesday, 05/29/19. He says on yesterday after taking them, he was dizzy all day. He says today he took the doxazosin only. He says he is not as dizzy today as yesterday and doesn't know which medication is causing it. I advised I will send this to Dr. Yetta Barre for review and someone will call back with her recommendation. Patient verbalized understanding.   Reason for Disposition . [1] Caller has NON-URGENT medicine question about med that PCP prescribed AND [2] triager unable to answer question  Answer Assessment - Initial Assessment Questions 1. NAME of MEDICATION: "What medicine are you calling about?"     I don't know which one, I started 2 new medications on Tuesday 2. QUESTION: "What is your question?" (e.g., medication refill, side effect)     I have been dizzy 3. PRESCRIBING HCP: "Who prescribed it?" Reason: if prescribed by specialist, call should be referred to that group.     Dr. Yetta Barre 4. SYMPTOMS: "Do you have any symptoms?"     Dizziness 5. SEVERITY: If symptoms are present, ask "Are they mild, moderate or severe?"     N/A 6. PREGNANCY:  "Is there any chance that you are pregnant?" "When was your last menstrual period?"     N/A  Protocols used: MEDICATION QUESTION CALL-A-AH

## 2020-05-30 NOTE — Telephone Encounter (Signed)
I called pt back and had to leave a message. I told him to continue to Doxy, as this was an antibiotic for the prostate infection. He may get a pill splitter and split the Doxazosin in half. Take a half of a tablet, instead of the whole, once a day. Drink plenty of fluids to prevent dehydration and call me on Monday to let me know if the dizziness improved.

## 2020-06-16 ENCOUNTER — Ambulatory Visit (INDEPENDENT_AMBULATORY_CARE_PROVIDER_SITE_OTHER): Payer: BC Managed Care – PPO | Admitting: Surgery

## 2020-06-16 ENCOUNTER — Encounter: Payer: Self-pay | Admitting: Surgery

## 2020-06-16 ENCOUNTER — Other Ambulatory Visit: Payer: Self-pay

## 2020-06-16 VITALS — BP 176/100 | HR 70 | Temp 98.9°F | Ht 67.0 in | Wt 185.2 lb

## 2020-06-16 DIAGNOSIS — K429 Umbilical hernia without obstruction or gangrene: Secondary | ICD-10-CM

## 2020-06-16 DIAGNOSIS — K4090 Unilateral inguinal hernia, without obstruction or gangrene, not specified as recurrent: Secondary | ICD-10-CM

## 2020-06-16 NOTE — Patient Instructions (Addendum)
Our surgery scheduler Britta Mccreedy will call you within 24-48 hours to get you scheduled. If you have not heard from her after 48 hours, please call our office. You will need to get Covid tested before surgery and have the blue sheet available when she calls to write down important information. We will contact Dr. Yetta Barre to get a medical clearance. If you have any concerns or questions, please feel free to call our office.    Umbilical Hernia, Adult  A hernia is a bulge of tissue that pushes through an opening between muscles. An umbilical hernia happens in the abdomen, near the belly button (umbilicus). The hernia may contain tissues from the small intestine, large intestine, or fatty tissue covering the intestines (omentum). Umbilical hernias in adults tend to get worse over time, and they require surgical treatment. There are several types of umbilical hernias. You may have:  A hernia located just above or below the umbilicus (indirect hernia). This is the most common type of umbilical hernia in adults.  A hernia that forms through an opening formed by the umbilicus (direct hernia).  A hernia that comes and goes (reducible hernia). A reducible hernia may be visible only when you strain, lift something heavy, or cough. This type of hernia can be pushed back into the abdomen (reduced).  A hernia that traps abdominal tissue inside the hernia (incarcerated hernia). This type of hernia cannot be reduced.  A hernia that cuts off blood flow to the tissues inside the hernia (strangulated hernia). The tissues can start to die if this happens. This type of hernia requires emergency treatment. What are the causes? An umbilical hernia happens when tissue inside the abdomen presses on a weak area of the abdominal muscles. What increases the risk? You may have a greater risk of this condition if you:  Are obese.  Have had several pregnancies.  Have a buildup of fluid inside your abdomen (ascites).  Have  had surgery that weakens the abdominal muscles. What are the signs or symptoms? The main symptom of this condition is a painless bulge at or near the belly button. A reducible hernia may be visible only when you strain, lift something heavy, or cough. Other symptoms may include:  Dull pain.  A feeling of pressure. Symptoms of a strangulated hernia may include:  Pain that gets increasingly worse.  Nausea and vomiting.  Pain when pressing on the hernia.  Skin over the hernia becoming red or purple.  Constipation.  Blood in the stool. How is this diagnosed? This condition may be diagnosed based on:  A physical exam. You may be asked to cough or strain while standing. These actions increase the pressure inside your abdomen and force the hernia through the opening in your muscles. Your health care provider may try to reduce the hernia by pressing on it.  Your symptoms and medical history. How is this treated? Surgery is the only treatment for an umbilical hernia. Surgery for a strangulated hernia is done as soon as possible. If you have a small hernia that is not incarcerated, you may need to lose weight before having surgery. Follow these instructions at home:  Lose weight, if told by your health care provider.  Do not try to push the hernia back in.  Watch your hernia for any changes in color or size. Tell your health care provider if any changes occur.  You may need to avoid activities that increase pressure on your hernia.  Do not lift anything that is  heavier than 10 lb (4.5 kg) until your health care provider says that this is safe.  Take over-the-counter and prescription medicines only as told by your health care provider.  Keep all follow-up visits as told by your health care provider. This is important. Contact a health care provider if:  Your hernia gets larger.  Your hernia becomes painful. Get help right away if:  You develop sudden, severe pain near the area  of your hernia.  You have pain as well as nausea or vomiting.  You have pain and the skin over your hernia changes color.  You develop a fever. This information is not intended to replace advice given to you by your health care provider. Make sure you discuss any questions you have with your health care provider. Document Revised: 06/08/2017 Document Reviewed: 10/25/2016 Elsevier Patient Education  2021 Elsevier Inc.     Inguinal Hernia, Adult An inguinal hernia is when fat or your intestines push through a weak spot in a muscle where your leg meets your lower belly (groin). This causes a bulge. This kind of hernia could also be:  In your scrotum, if you are male.  In folds of skin around your vagina, if you are male. There are three types of inguinal hernias:  Hernias that can be pushed back into the belly (are reducible). This type rarely causes pain.  Hernias that cannot be pushed back into the belly (are incarcerated).  Hernias that cannot be pushed back into the belly and lose their blood supply (are strangulated). This type needs emergency surgery. What are the causes? This condition is caused by having a weak spot in the muscles or tissues in your groin. This develops over time. The hernia may poke through the weak spot when you strain your lower belly muscles all of a sudden, such as when you:  Lift a heavy object.  Strain to poop (have a bowel movement). Trouble pooping (constipation) can lead to straining.  Cough. What increases the risk? This condition is more likely to develop in:  Males.  Pregnant females.  People who: ? Are overweight. ? Work in jobs that require long periods of standing or heavy lifting. ? Have had an inguinal hernia before. ? Smoke or have lung disease. These factors can lead to long-term (chronic) coughing. What are the signs or symptoms? Symptoms may depend on the size of the hernia. Often, a small hernia has no symptoms. Symptoms  of a larger hernia may include:  A bulge in the groin area. This is easier to see when standing. You might not be able to see it when you are lying down.  Pain or burning in the groin. This may get worse when you lift, strain, or cough.  A dull ache or a feeling of pressure in the groin.  An abnormal bulge in the scrotum, in males. Symptoms of a strangulated inguinal hernia may include:  A bulge in your groin that is very painful and tender to the touch.  A bulge that turns red or purple.  Fever, feeling like you may vomit (nausea), and vomiting.  Not being able to poop or to pass gas. How is this treated? Treatment depends on the size of your hernia and whether you have symptoms. If you do not have symptoms, your doctor may have you watch your hernia carefully and have you come in for follow-up visits. If your hernia is large or if you have symptoms, you may need surgery to repair the hernia.  Follow these instructions at home: Lifestyle  Avoid lifting heavy objects.  Avoid standing for long amounts of time.  Do not smoke or use any products that contain nicotine or tobacco. If you need help quitting, ask your doctor.  Stay at a healthy weight. Prevent trouble pooping You may need to take these actions to prevent or treat trouble pooping:  Drink enough fluid to keep your pee (urine) pale yellow.  Take over-the-counter or prescription medicines.  Eat foods that are high in fiber. These include beans, whole grains, and fresh fruits and vegetables.  Limit foods that are high in fat and sugar. These include fried or sweet foods. General instructions  You may try to push your hernia back in place by very gently pressing on it when you are lying down. Do not try to push the bulge back in if it will not go in easily.  Watch your hernia for any changes in shape, size, or color. Tell your doctor if you see any changes.  Take over-the-counter and prescription medicines only as  told by your doctor.  Keep all follow-up visits. Contact a doctor if:  You have a fever or chills.  You have new symptoms.  Your symptoms get worse. Get help right away if:  You have pain in your groin that gets worse all of a sudden.  You have a bulge in your groin that: ? Gets bigger all of a sudden, and it does not get smaller after that. ? Turns red or purple. ? Is painful when you touch it.  You are a male, and you have: ? Sudden pain in your scrotum. ? A sudden change in the size of your scrotum.  You cannot push the hernia back in place by very gently pressing on it when you are lying down.  You feel like you may vomit, and that feeling does not go away.  You keep vomiting.  You have a fast heartbeat.  You cannot poop or pass gas. These symptoms may be an emergency. Get help right away. Call your local emergency services (911 in the [place]  Do not wait to see if the symptoms will go away.  Do not drive yourself to the hospital. Summary  An inguinal hernia is when fat or your intestines push through a weak spot in a muscle where your leg meets your lower belly (groin). This causes a bulge.  If you do not have symptoms, you may not need treatment. If you have symptoms or a large hernia, you may need surgery.  Avoid lifting heavy objects. Also, avoid standing for long amounts of time.  Do not try to push the bulge back in if it will not go in easily. This information is not intended to replace advice given to you by your health care provider. Make sure you discuss any questions you have with your health care provider. Document Revised: 12/25/2019 Document Reviewed: 12/25/2019 Elsevier Patient Education  2021 [employer].

## 2020-06-16 NOTE — Progress Notes (Signed)
06/16/2020  Reason for Visit:  Umbilical hernia  Referring Provider:  Elizabeth Sauer, MD  History of Present Illness: Jason [name] is a 65 y.o. male presenting for evaluation of a left inguinal hernia.  The patient reports that he's had this since about 2019 and feels that it's been getting bigger.  He was actually referred to our group in 2020 but did not show up for the appointment.  Overall feels discomfort when he bends down in the left groin, and notices the bulging sensation when he's doing strenuous activity at work.  However, when he relaxes, the bulging subsides.  Denies any issues on the right groin.  Denies any constipation, diarrhea, nausea, or vomiting.  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  He is able to do his work without shortness of breath, but does report having some episodes of chest pain, but he attributes it to muscular issues instead.  Denies any cardiac issues in the past and the pain does not radiate to the arm or jaw.    Past Medical History: Past Medical History:  Diagnosis Date  . Hypertension      Past Surgical History: --Left shoulder / rotator cuff surgery.  Home Medications: Prior to Admission medications   Medication Sig Start Date End Date Taking? Authorizing Provider  doxazosin (CARDURA) 1 MG tablet Take 1 tablet (1 mg total) by mouth daily. 05/23/20   Duanne Limerick, MD  doxycycline (VIBRA-TABS) 100 MG tablet Take 1 tablet (100 mg total) by mouth 2 (two) times daily. 05/23/20   Duanne Limerick, MD  valsartan-hydrochlorothiazide (DIOVAN-HCT) 160-25 MG tablet Take 1 tablet by mouth daily. 05/23/20   Duanne Limerick, MD    Allergies: [name] Known Allergies  Social History:  reports that he has never smoked. He has never used smokeless tobacco. He reports that he does not drink alcohol and does not use drugs.   Family History: Family History  Problem Relation Age of Onset  . Diabetes Mother   . Prostatitis Father   . Prostatitis Paternal Aunt      Review of Systems: Review of Systems  Constitutional: Negative for chills and fever.  HENT: Negative for hearing loss.   Respiratory: Negative for shortness of breath.   Cardiovascular: Positive for chest pain.  Gastrointestinal: Positive for abdominal pain (left groin discomfort at times). Negative for constipation, diarrhea, nausea and vomiting.  Genitourinary: Negative for dysuria.  Musculoskeletal: Positive for myalgias (chest pain).  Skin: Negative for rash.  Neurological: Negative for dizziness.  Psychiatric/Behavioral: Negative for depression.    Physical Exam BP (!) 176/100   Pulse 70   Temp 98.9 F (37.2 C) (Oral)   Ht 5\' 7"  (1.702 m)   Wt 185 lb 3.2 oz (84 kg)   SpO2 98%   BMI 29.01 kg/m  CONSTITUTIONAL: [name] acute distress HEENT:  Normocephalic, atraumatic, extraocular motion intact. NECK: Trachea is midline, and there is [name] jugular venous distension.  RESPIRATORY:  Lungs are clear bilaterally.  Normal respiratory effort without pathologic use of accessory muscles. CARDIOVASCULAR: Regular rhythm and rate without murmurs. GI: The abdomen is soft, non-distended, non-tender.  The patient has a small reducible left inguinal hernia and a small reducible umbilical hernia.  There is [name] palpable hernia in the right groin.  MUSCULOSKELETAL:  Normal muscle strength and tone in all four extremities.  [name] peripheral edema or cyanosis. SKIN: Skin turgor is normal. There are [name] pathologic skin lesions.  NEUROLOGIC:  Motor and sensation is grossly normal.  Cranial nerves are grossly intact. PSYCH:  Alert and oriented to person, place and time. Affect is normal.  Laboratory Analysis: [name] results found for this or any previous visit (from the past 24 hour(s)).  Imaging: [name] results found.  Assessment and Plan: This is a 64 y.o. male with a left inguinal and umbilical hernias.  --Discussed with the patient that he has a left inguinal hernia and also an umbilical hernia.   Discussed how these form and that there is [name] medical treatment to close them.  He has not had any intra-abdominal surgeries before, and would be a good candidate to robotic approach.  Reviewed with him the minimally invasive hernia repair approach at length, including risks of bleeding, infection, injury to surrounding structures, and the risk of chronic pain.  The umbilical hernia would be repaired during the same procedure, and if there is a right inguinal hernia noted, it can be repaired at the same time as well.  He is willing to proceed.  He understands that he would need a COVID test prior to surgery. --He will think about the timing for surgery.  He has a coming up appointment with an ophthalmologist for glaucoma, so he has to figure out when to do what surgery.  He will call us with the date he has in mind so we can schedule him in the future.  In the meantime, we'll send a medical clearance form to his PCP.   Face-to-face time spent with the patient and care providers was 60 minutes, with more than 50% of the time spent counseling, educating, and coordinating care of the patient.     Howie Ill, MD [facility]

## 2020-06-20 ENCOUNTER — Telehealth: Payer: Self-pay | Admitting: Surgery

## 2020-06-20 NOTE — Telephone Encounter (Signed)
Left message for patient to call to see if he wants to proceed with scheduling robotic left inguinal hernia and open umbilical hernia surgery.  Will need booking sheet if patient decides to proceed.  Thank you.

## 2020-06-23 ENCOUNTER — Ambulatory Visit (INDEPENDENT_AMBULATORY_CARE_PROVIDER_SITE_OTHER): Payer: BC Managed Care – PPO | Admitting: Family Medicine

## 2020-06-23 ENCOUNTER — Other Ambulatory Visit: Payer: Self-pay

## 2020-06-23 ENCOUNTER — Ambulatory Visit: Payer: BC Managed Care – PPO | Admitting: Family Medicine

## 2020-06-23 ENCOUNTER — Encounter: Payer: Self-pay | Admitting: Family Medicine

## 2020-06-23 VITALS — BP 140/100 | HR 84 | Ht 67.0 in | Wt 184.0 lb

## 2020-06-23 DIAGNOSIS — I10 Essential (primary) hypertension: Secondary | ICD-10-CM

## 2020-06-23 MED ORDER — AMLODIPINE BESYLATE 5 MG PO TABS: 5.0000 mg | ORAL_TABLET | Freq: Every day | ORAL | 1 refills | Status: DC

## 2020-06-23 NOTE — Patient Instructions (Signed)

## 2020-06-23 NOTE — Progress Notes (Signed)
Date:  06/23/2020   Name:  Jason [name]   DOB:  August 01, 1955   MRN:  [id number]   Chief Complaint: Hypertension (Follow up on bp)  Hypertension This is a chronic problem. The current episode started more than 1 year ago. The problem is unchanged. The problem is uncontrolled. Pertinent negatives include no anxiety, blurred vision, chest pain, headaches, malaise/fatigue, neck pain, orthopnea, palpitations, peripheral edema, PND, shortness of breath or sweats. There are no associated agents to hypertension. Risk factors for coronary artery disease include dyslipidemia. Past treatments include angiotensin blockers, diuretics and alpha 1 blockers. The current treatment provides no improvement. There are no compliance problems.  There is no history of angina, kidney disease, CAD/MI, CVA, heart failure, left ventricular hypertrophy, PVD or retinopathy. There is no history of chronic renal disease, a hypertension causing med or renovascular disease.    Lab Results  Component Value Date   CREATININE 0.95 10/24/2018   BUN 15 10/24/2018   NA 138 10/24/2018   K 4.1 10/24/2018   CL 99 10/24/2018   CO2 25 10/24/2018   Lab Results  Component Value Date   CHOL 149 05/25/2017   HDL 42 05/25/2017   LDLCALC 85 05/25/2017   TRIG 110 05/25/2017   CHOLHDL 3.5 05/25/2017   Lab Results  Component Value Date   TSH 1.380 05/25/2017   Lab Results  Component Value Date   HGBA1C 4.6 (L) 05/25/2017   Lab Results  Component Value Date   WBC 5.7 05/25/2017   HGB 15.2 05/25/2017   HCT 47.6 05/25/2017   MCV 95 05/25/2017   PLT 221 05/25/2017   Lab Results  Component Value Date   ALT 24 05/25/2017   AST 22 05/25/2017   ALKPHOS 77 05/25/2017   BILITOT 0.5 05/25/2017     Review of Systems  Constitutional: Negative for chills, fever and malaise/fatigue.  HENT: Negative for drooling, ear discharge, ear pain and sore throat.   Eyes: Negative for blurred vision.  Respiratory: Negative for cough,  shortness of breath and wheezing.   Cardiovascular: Negative for chest pain, palpitations, orthopnea, leg swelling and PND.  Gastrointestinal: Negative for abdominal pain, blood in stool, constipation, diarrhea and nausea.  Endocrine: Negative for polydipsia.  Genitourinary: Negative for dysuria, frequency, hematuria and urgency.  Musculoskeletal: Negative for back pain, myalgias and neck pain.  Skin: Negative for rash.  Allergic/Immunologic: Negative for environmental allergies.  Neurological: Negative for dizziness and headaches.  Hematological: Does not bruise/bleed easily.  Psychiatric/Behavioral: Negative for suicidal ideas. The patient is not nervous/anxious.     Patient Active Problem List   Diagnosis Date Noted  . Essential hypertension 05/25/2017  . Ventral hernia 05/25/2017  . Exertional chest pain 05/25/2017  . Cervical radiculopathy 05/25/2017  . FH: diabetes mellitus 05/25/2017    No Known Allergies  No past surgical history on file.  Social History   Tobacco Use  . Smoking status: Never Smoker  . Smokeless tobacco: Never Used  Vaping Use  . Vaping Use: Never used  Substance Use Topics  . Alcohol use: No  . Drug use: No     Medication list has been reviewed and updated.  Current Meds  Medication Sig  . doxazosin (CARDURA) 1 MG tablet Take 1 tablet (1 mg total) by mouth daily.  . valsartan-hydrochlorothiazide (DIOVAN-HCT) 160-25 MG tablet Take 1 tablet by mouth daily.    PHQ 2/9 Scores 05/23/2020 10/24/2018 05/25/2017  PHQ - 2 Score 0 0 0  PHQ- 9 Score -  0 -    No flowsheet data found.  BP Readings from Last 3 Encounters:  06/23/20 (!) 140/100  06/16/20 (!) 176/100  05/23/20 (!) 140/100    Physical Exam Vitals and nursing note reviewed.  HENT:     Head: Normocephalic.     Right Ear: Tympanic membrane, ear canal and external ear normal. There is no impacted cerumen.     Left Ear: Tympanic membrane, ear canal and external ear normal. There is no  impacted cerumen.     Nose: Nose normal. No congestion or rhinorrhea.     Mouth/Throat:     Mouth: Oropharynx is clear and moist. Mucous membranes are moist.  Eyes:     General: No scleral icterus.       Right eye: No discharge.        Left eye: No discharge.     Extraocular Movements: EOM normal.     Conjunctiva/sclera: Conjunctivae normal.     Pupils: Pupils are equal, round, and reactive to light.  Neck:     Thyroid: No thyromegaly.     Vascular: No JVD.     Trachea: No tracheal deviation.  Cardiovascular:     Rate and Rhythm: Normal rate and regular rhythm.     Pulses: Intact distal pulses.     Heart sounds: Normal heart sounds. No murmur heard. No friction rub. No gallop.   Pulmonary:     Effort: No respiratory distress.     Breath sounds: Normal breath sounds. No wheezing, rhonchi or rales.  Abdominal:     General: Bowel sounds are normal.     Palpations: Abdomen is soft. There is no hepatosplenomegaly or mass.     Tenderness: There is no abdominal tenderness. There is no CVA tenderness, guarding or rebound.  Musculoskeletal:        General: No tenderness or edema. Normal range of motion.     Cervical back: Normal range of motion and neck supple.  Lymphadenopathy:     Cervical: No cervical adenopathy.  Skin:    General: Skin is warm.     Findings: No rash.  Neurological:     Mental Status: He is alert and oriented to person, place, and time.     Cranial Nerves: No cranial nerve deficit.     Deep Tendon Reflexes: Strength normal and reflexes are normal and symmetric.     Wt Readings from Last 3 Encounters:  06/23/20 184 lb (83.5 kg)  06/16/20 185 lb 3.2 oz (84 kg)  05/23/20 181 lb (82.1 kg)    BP (!) 140/100   Pulse 84   Ht 5\' 7"  (1.702 m)   Wt 184 lb (83.5 kg)   BMI 28.82 kg/m   Assessment and Plan: 1. Essential hypertension Chronic.  Uncontrolled.  Blood pressure is still 140/100.  Patient will continue his valsartan hydrochlorothiazide 1 60-25 and DOCS  607 1 mg a day.  We will add amlodipine 5 mg once a day.  Will recheck blood pressure in 6 weeks and we will obtain both renal panel and lipid panel at this time. - Renal Function Panel - Lipid Panel With LDL/HDL Ratio - amLODipine (NORVASC) 5 MG tablet; Take 1 tablet (5 mg total) by mouth daily.  Dispense: 90 tablet; Refill: 1

## 2020-06-24 LAB — RENAL FUNCTION PANEL
Albumin: 4.6 g/dL (ref 3.8–4.8)
BUN/Creatinine Ratio: 16 (ref 10–24)
BUN: 16 mg/dL (ref 8–27)
CO2: 25 mmol/L (ref 20–29)
Calcium: 9.3 mg/dL (ref 8.6–10.2)
Chloride: 100 mmol/L (ref 96–106)
Creatinine, Ser: 1.01 mg/dL (ref 0.76–1.27)
GFR calc Af Amer: 90 mL/min/{1.73_m2} (ref >59)
GFR calc non Af Amer: 78 mL/min/{1.73_m2} (ref >59)
Glucose: 106 mg/dL — ABNORMAL HIGH (ref 65–99)
Phosphorus: 3.1 mg/dL (ref 2.8–4.1)
Potassium: 4.2 mmol/L (ref 3.5–5.2)
Sodium: 139 mmol/L (ref 134–144)

## 2020-06-24 LAB — LIPID PANEL WITH LDL/HDL RATIO
Cholesterol, Total: 148 mg/dL (ref 100–199)
HDL: 46 mg/dL (ref >39)
LDL Chol Calc (NIH): 84 mg/dL (ref 0–99)
LDL/HDL Ratio: 1.8 ratio (ref 0.0–3.6)
Triglycerides: 94 mg/dL (ref 0–149)
VLDL Cholesterol Cal: 18 mg/dL (ref 5–40)

## 2020-07-07 ENCOUNTER — Other Ambulatory Visit: Payer: Self-pay | Admitting: Surgery

## 2020-07-07 NOTE — Addendum Note (Signed)
Addended by: Myrtie Hawk on: 07/07/2020 03:45 PM   Modules accepted: Orders, SmartSet

## 2020-07-08 ENCOUNTER — Telehealth: Payer: Self-pay | Admitting: Surgery

## 2020-07-08 NOTE — Telephone Encounter (Signed)
Outgoing call is made, left message for patient to call me so that we can go over some surgery dates for scheduling.  ? ?

## 2020-07-09 ENCOUNTER — Telehealth: Payer: Self-pay

## 2020-07-09 NOTE — Telephone Encounter (Signed)
Medical Clearance faxed to Dr.Deanna Jones at this time.

## 2020-07-10 ENCOUNTER — Telehealth: Payer: Self-pay | Admitting: Surgery

## 2020-07-10 NOTE — Telephone Encounter (Signed)
Outgoing call is made, left message for patient to call.  Please advise patient of Pre-Admission date/time, COVID Testing date and Surgery date.  Surgery Date: 07/24/20 Preadmission Testing Date: 07/17/20 (phone 8a-1p) Covid Testing Date: 07/22/20 - patient [facility] to [facility] ([address] [facility]) between 8a-1p  Also patient to call at [phone], between 1-3:00pm the day before surgery, to find out what time to arrive for surgery.

## 2020-07-11 NOTE — Telephone Encounter (Signed)
Patient returns call, he is now informed of all dates regarding his surgery and voices understanding.  

## 2020-07-16 ENCOUNTER — Telehealth: Payer: Self-pay

## 2020-07-16 NOTE — Telephone Encounter (Unsigned)
Copied from CRM [id number]. Topic: General - Inquiry >> Jul 16, 2020 10:14 AM Crist Infante wrote: Reason for CRM: Velna Hatchet from [facility] surgical calling to ask if you could complete surgical clearance they faxed 3/02 to you returned today.  Pt has surgery 3/17.

## 2020-07-16 NOTE — Telephone Encounter (Signed)
Scheduled for 3/10

## 2020-07-17 ENCOUNTER — Other Ambulatory Visit: Payer: Self-pay

## 2020-07-17 ENCOUNTER — Other Ambulatory Visit
Admission: RE | Admit: 2020-07-17 | Discharge: 2020-07-17 | Disposition: A | Discharge status: Home / Self Care | Payer: BC Managed Care – PPO | Source: Ambulatory Visit | Attending: Surgery | Admitting: Surgery

## 2020-07-17 DIAGNOSIS — H2513 Age-related nuclear cataract, bilateral: Secondary | ICD-10-CM | POA: Diagnosis not present

## 2020-07-17 DIAGNOSIS — H47233 Glaucomatous optic atrophy, bilateral: Secondary | ICD-10-CM | POA: Diagnosis not present

## 2020-07-17 HISTORY — DX: Unspecified asthma, uncomplicated: J45.909

## 2020-07-17 NOTE — Patient Instructions (Signed)
Your procedure is scheduled on: Thursday July 24, 2020. Report to Day [facility] inside [facility] 2nd floor (stop by Admissions desk first before getting on Elevator). To find out your arrival time please call [phone] between 1PM - 3PM on Wednesday July 23, 2020.  Remember: Instructions that are not followed completely may result in serious medical risk,  up to and including death, or upon the discretion of your surgeon and anesthesiologist your  surgery may need to be rescheduled.     _X__ 1. Do not eat food after midnight the night before your procedure.                 No chewing gum or hard candies. You may drink clear liquids up to 2 hours                 before you are scheduled to arrive for your surgery- DO not drink clear                 liquids within 2 hours of the start of your surgery.                 Clear Liquids include:  water, apple juice without pulp, clear Gatorade, G2 or                  Gatorade Zero (avoid Red/Purple/Blue), Black Coffee or Tea (Do not add                 anything to coffee or tea).  __X__2.  On the morning of surgery brush your teeth with toothpaste and water, you                may rinse your mouth with mouthwash if you wish.  Do not swallow any toothpaste of mouthwash.     _X__ 3.  No Alcohol for 24 hours before or after surgery.   _X__ 4.  Do Not Smoke or use e-cigarettes For 24 Hours Prior to Your Surgery.                 Do not use any chewable tobacco products for at least 6 hours prior to                 Surgery.  _X__  5.  Do not use any recreational drugs (marijuana, cocaine, heroin, ecstasy, MDMA or other)                For at least one week prior to your surgery.  Combination of these drugs with anesthesia                May have life threatening results.  __X__ 6.  Notify your doctor if there is any change in your medical condition      (cold, fever, infections).     Do not wear jewelry, make-up,  hairpins, clips or nail polish. Do not wear lotions, powders, or perfumes. You may wear deodorant. Do not shave 48 hours prior to surgery. Men may shave face and neck. Do not bring valuables to the hospital.    [facility] is not responsible for any belongings or valuables.  Contacts, dentures or bridgework may not be worn into surgery. Leave your suitcase in the car. After surgery it may be brought to your room. For patients admitted to the hospital, discharge time is determined by your treatment team.   Patients discharged the day of surgery will not be allowed to drive  home.   Make arrangements for someone to be with you for the first 24 hours of your Same Day Discharge.  __x__ Take these medicines the morning of surgery with A SIP OF WATER:    1. doxazosin (CARDURA) 1 MG  2.   3.   4.  5.  6.  ____ Fleet Enema (as directed)   __X__ Use CHG Soap (or wipes) as directed  ____ Use Benzoyl Peroxide Gel as instructed  ____ Use inhalers on the day of surgery  ____ Stop metformin 2 days prior to surgery    __X__ Stop Anti-inflammatories such as Ibuprofen, Aleve, Advil, naproxen, aspirin, and or BC powders.    __X__ Stop supplements until after surgery. Cod Liver Oil 1000 MG and Ascorbic Acid (VITAMIN C) 1000 MG    __X__ Do not start any herbal supplements before your procedure.    If you have any questions regarding your pre-procedure instructions,  Please call Pre-admit Testing at [phone].

## 2020-07-18 DIAGNOSIS — H259 Unspecified age-related cataract: Secondary | ICD-10-CM | POA: Insufficient documentation

## 2020-07-18 DIAGNOSIS — H409 Unspecified glaucoma: Secondary | ICD-10-CM | POA: Insufficient documentation

## 2020-07-21 ENCOUNTER — Encounter: Payer: Self-pay | Admitting: Family Medicine

## 2020-07-21 ENCOUNTER — Ambulatory Visit (INDEPENDENT_AMBULATORY_CARE_PROVIDER_SITE_OTHER): Payer: BC Managed Care – PPO | Admitting: Family Medicine

## 2020-07-21 ENCOUNTER — Other Ambulatory Visit
Admission: RE | Admit: 2020-07-21 | Discharge: 2020-07-21 | Disposition: A | Discharge status: Home / Self Care | Payer: BC Managed Care – PPO | Source: Ambulatory Visit | Attending: Surgery | Admitting: Surgery

## 2020-07-21 ENCOUNTER — Other Ambulatory Visit: Payer: Self-pay

## 2020-07-21 VITALS — BP 152/80 | HR 66 | Ht 67.0 in | Wt 179.0 lb

## 2020-07-21 DIAGNOSIS — K4020 Bilateral inguinal hernia, without obstruction or gangrene, not specified as recurrent: Secondary | ICD-10-CM | POA: Diagnosis not present

## 2020-07-21 DIAGNOSIS — Z01818 Encounter for other preprocedural examination: Secondary | ICD-10-CM

## 2020-07-21 DIAGNOSIS — K429 Umbilical hernia without obstruction or gangrene: Secondary | ICD-10-CM | POA: Diagnosis not present

## 2020-07-21 DIAGNOSIS — Z79899 Other long term (current) drug therapy: Secondary | ICD-10-CM | POA: Diagnosis not present

## 2020-07-21 DIAGNOSIS — Z136 Encounter for screening for cardiovascular disorders: Secondary | ICD-10-CM | POA: Diagnosis not present

## 2020-07-21 DIAGNOSIS — I10 Essential (primary) hypertension: Secondary | ICD-10-CM | POA: Insufficient documentation

## 2020-07-21 DIAGNOSIS — Z20822 Contact with and (suspected) exposure to covid-19: Secondary | ICD-10-CM | POA: Insufficient documentation

## 2020-07-21 LAB — BASIC METABOLIC PANEL
Anion gap: 7 (ref 5–15)
BUN: 16 mg/dL (ref 8–23)
CO2: 28 mmol/L (ref 22–32)
Calcium: 8.9 mg/dL (ref 8.9–10.3)
Chloride: 103 mmol/L (ref 98–111)
Creatinine, Ser: 0.87 mg/dL (ref 0.61–1.24)
GFR, Estimated: >60 mL/min (ref >60)
Glucose, Bld: 135 mg/dL — ABNORMAL HIGH (ref 70–99)
Potassium: 3.3 mmol/L — ABNORMAL LOW (ref 3.5–5.1)
Sodium: 138 mmol/L (ref 135–145)

## 2020-07-21 LAB — CBC
HCT: 43.1 % (ref 39.0–52.0)
Hemoglobin: 14.1 g/dL (ref 13.0–17.0)
MCH: 30.5 pg (ref 26.0–34.0)
MCHC: 32.7 g/dL (ref 30.0–36.0)
MCV: 93.1 fL (ref 80.0–100.0)
Platelets: 179 10*3/uL (ref 150–400)
RBC: 4.63 MIL/uL (ref 4.22–5.81)
RDW: 11.4 % — ABNORMAL LOW (ref 11.5–15.5)
WBC: 3.7 10*3/uL — ABNORMAL LOW (ref 4.0–10.5)
nRBC: 0 % (ref 0.0–0.2)

## 2020-07-21 LAB — SARS CORONAVIRUS 2 (TAT 6-24 HRS): SARS Coronavirus 2: NEGATIVE

## 2020-07-21 NOTE — Progress Notes (Signed)
Date:  07/21/2020   Name:  Jason [name]   DOB:  1955/07/03   MRN:  [id number]   Chief Complaint: Pre-op Exam (Patient having Inguinal hernia Repair on 07/24/2020 w/ [facility].)  Patient is a 65 year old male who presents for a preoperative physical exam exam. The patient reports the following problems: uncontrolled blood pressure. Health maintenance has been reviewed up to date.  Hypertension This is a chronic problem. The current episode started more than 1 year ago. The problem is unchanged. The problem is uncontrolled. Pertinent negatives include no anxiety, blurred vision, chest pain, headaches, malaise/fatigue, neck pain, orthopnea, palpitations, peripheral edema, PND, shortness of breath or sweats. Past treatments include angiotensin blockers and diuretics. The current treatment provides no improvement. Compliance problems include medication side effects (did not start amlodipine).  There is no history of angina, kidney disease, CAD/MI, CVA, heart failure, left ventricular hypertrophy, PVD or retinopathy. There is no history of chronic renal disease, a hypertension causing med or renovascular disease.    Lab Results  Component Value Date   CREATININE 1.01 06/23/2020   BUN 16 06/23/2020   NA 139 06/23/2020   K 4.2 06/23/2020   CL 100 06/23/2020   CO2 25 06/23/2020   Lab Results  Component Value Date   CHOL 148 06/23/2020   HDL 46 06/23/2020   LDLCALC 84 06/23/2020   TRIG 94 06/23/2020   CHOLHDL 3.5 05/25/2017   Lab Results  Component Value Date   TSH 1.380 05/25/2017   Lab Results  Component Value Date   HGBA1C 4.6 (L) 05/25/2017   Lab Results  Component Value Date   WBC 5.7 05/25/2017   HGB 15.2 05/25/2017   HCT 47.6 05/25/2017   MCV 95 05/25/2017   PLT 221 05/25/2017   Lab Results  Component Value Date   ALT 24 05/25/2017   AST 22 05/25/2017   ALKPHOS 77 05/25/2017   BILITOT 0.5 05/25/2017     Review of Systems  Constitutional: Negative for  chills, fever and malaise/fatigue.  HENT: Negative for drooling, ear discharge, ear pain and sore throat.   Eyes: Negative for blurred vision.  Respiratory: Negative for cough, shortness of breath and wheezing.   Cardiovascular: Negative for chest pain, palpitations, orthopnea, leg swelling and PND.  Gastrointestinal: Negative for abdominal pain, blood in stool, constipation, diarrhea and nausea.  Endocrine: Negative for polydipsia.  Genitourinary: Negative for dysuria, frequency, hematuria and urgency.  Musculoskeletal: Negative for back pain, myalgias and neck pain.  Skin: Negative for rash.  Allergic/Immunologic: Negative for environmental allergies.  Neurological: Negative for dizziness and headaches.  Hematological: Does not bruise/bleed easily.  Psychiatric/Behavioral: Negative for suicidal ideas. The patient is not nervous/anxious.     Patient Active Problem List   Diagnosis Date Noted  . Essential hypertension 05/25/2017  . Ventral hernia 05/25/2017  . Exertional chest pain 05/25/2017  . Cervical radiculopathy 05/25/2017  . FH: diabetes mellitus 05/25/2017    No Known Allergies  Past Surgical History:  Procedure Laterality Date  . SHOULDER ARTHROSCOPY W/ ROTATOR CUFF REPAIR Left 2014  . UPPER GI ENDOSCOPY      Social History   Tobacco Use  . Smoking status: Never Smoker  . Smokeless tobacco: Never Used  Vaping Use  . Vaping Use: Never used  Substance Use Topics  . Alcohol use: Yes    Alcohol/week: 1.0 standard drink    Types: 1 Cans of beer per week  . Drug use: No  Medication list has been reviewed and updated.  Current Meds  Medication Sig  . Ascorbic Acid (VITAMIN C) 1000 MG tablet Take 1,000 mg by mouth daily.  . cholecalciferol (VITAMIN D3) 25 MCG (1000 UNIT) tablet Take 1,000 Units by mouth daily.  Marland Kitchen Cod Liver Oil 1000 MG CAPS Take 1,000 mg by mouth daily.  Marland Kitchen doxazosin (CARDURA) 1 MG tablet Take 1 tablet (1 mg total) by mouth daily.  .  valsartan-hydrochlorothiazide (DIOVAN-HCT) 160-25 MG tablet Take 1 tablet by mouth daily.    PHQ 2/9 Scores 07/21/2020 05/23/2020 10/24/2018 05/25/2017  PHQ - 2 Score 0 0 0 0  PHQ- 9 Score 0 - 0 -    GAD 7 : Generalized Anxiety Score 07/21/2020  Nervous, Anxious, on Edge 0  Control/stop worrying 0  Worry too much - different things 0  Trouble relaxing 0  Restless 0  Easily annoyed or irritable 0  Afraid - awful might happen 0  Total GAD 7 Score 0  Anxiety Difficulty Not difficult at all    BP Readings from Last 3 Encounters:  07/21/20 (!) 152/80  06/23/20 (!) 140/100  06/16/20 (!) 176/100    Physical Exam Vitals and nursing note reviewed.  HENT:     Head: Normocephalic.     Right Ear: Tympanic membrane, ear canal and external ear normal.     Left Ear: Tympanic membrane, ear canal and external ear normal.     Nose: Nose normal. No congestion or rhinorrhea.     Mouth/Throat:     Mouth: Mucous membranes are moist.  Eyes:     General: No scleral icterus.       Right eye: No discharge.        Left eye: No discharge.     Conjunctiva/sclera: Conjunctivae normal.     Pupils: Pupils are equal, round, and reactive to light.  Neck:     Thyroid: No thyromegaly.     Vascular: No JVD.     Trachea: No tracheal deviation.  Cardiovascular:     Rate and Rhythm: Normal rate and regular rhythm.     Heart sounds: Normal heart sounds, S1 normal and S2 normal. No murmur heard.  No systolic murmur is present.  No diastolic murmur is present. No friction rub. No gallop. No S3 or S4 sounds.   Pulmonary:     Effort: No respiratory distress.     Breath sounds: Normal breath sounds. No decreased breath sounds, wheezing, rhonchi or rales.  Abdominal:     General: Bowel sounds are normal.     Palpations: Abdomen is soft. There is no mass.     Tenderness: There is no abdominal tenderness. There is no guarding or rebound.  Musculoskeletal:        General: No tenderness. Normal range of motion.      Cervical back: Normal range of motion and neck supple.     Right lower leg: No edema.     Left lower leg: No edema.  Lymphadenopathy:     Cervical: No cervical adenopathy.  Skin:    General: Skin is warm.     Findings: No rash.  Neurological:     Mental Status: He is alert and oriented to person, place, and time.     Cranial Nerves: No cranial nerve deficit.     Deep Tendon Reflexes: Reflexes are normal and symmetric.     Wt Readings from Last 3 Encounters:  07/21/20 179 lb (81.2 kg)  07/17/20 183 lb (83 kg)  06/23/20 184 lb (83.5 kg)    BP (!) 152/80   Pulse 66   Ht 5\' 7"  (1.702 m)   Wt 179 lb (81.2 kg)   SpO2 98%   BMI 28.04 kg/m   Assessment and Plan:  1. Preop examination Patient presents for preoperative physical exam.  Physical exam is without any subjective/objective concerns noted.  From a medical clearance patient is cleared for surgery except for his blood pressure which remains elevated.  Patient refused to take amlodipine because he read the side effects and decided on his own not to take the medication.  Blood pressure remains elevated at today's reading.  We have cleared him for surgery with the recommendation that he refuses his amlodipine 5 mg and blood pressure remains elevated and that he is at medium procedural risk assessment at this time since blood pressure remains elevated at 152/80. 2.essential hypertension.  Patient has controlled chronic hypertension for which he is on valsartan hydrochlorothiazide 160-25 mg.  Patient was noted to be elevated on this for which we added amlodipine on last visit which patient did not take because he read side effects and was concerned about his heart.  Patient returns today with still elevated blood pressure readings and has a pending surgical procedure.  Patient has no other risk than his blood pressure is elevated in he has clearance for surgery and is at medium risk as noted above because of elevated blood pressure.

## 2020-07-22 ENCOUNTER — Other Ambulatory Visit: Payer: BC Managed Care – PPO

## 2020-07-22 NOTE — Progress Notes (Signed)
Received medical clearance for the patient from Dr Barnett Applebaum office. The patient is cleared at Medium risk for surgery due to his blood pressure. All notes are in Epic.

## 2020-07-23 MED ORDER — ACETAMINOPHEN 500 MG PO TABS: 1000.0000 mg | ORAL_TABLET | ORAL | Status: AC

## 2020-07-23 MED ORDER — CHLORHEXIDINE GLUCONATE 0.12 % MT SOLN: 15.0000 mL | Freq: Once | OROMUCOSAL | Status: AC

## 2020-07-23 MED ORDER — ORAL CARE MOUTH RINSE: 15.0000 mL | Freq: Once | OROMUCOSAL | Status: AC

## 2020-07-23 MED ORDER — CHLORHEXIDINE GLUCONATE CLOTH 2 % EX PADS: 6.0000 | MEDICATED_PAD | Freq: Once | CUTANEOUS | Status: DC

## 2020-07-23 MED ORDER — BUPIVACAINE LIPOSOME 1.3 % IJ SUSP: 20.0000 mL | Freq: Once | INTRAMUSCULAR | Status: DC

## 2020-07-23 MED ORDER — LACTATED RINGERS IV SOLN: INTRAVENOUS | Status: DC

## 2020-07-23 MED ORDER — CEFAZOLIN SODIUM-DEXTROSE 2-4 GM/100ML-% IV SOLN
2.0000 g | INTRAVENOUS | Status: AC
  Administered 2020-07-24: 2 g via INTRAVENOUS

## 2020-07-23 MED ORDER — GABAPENTIN 100 MG PO CAPS: 200.0000 mg | ORAL_CAPSULE | ORAL | Status: AC

## 2020-07-23 MED ORDER — FAMOTIDINE 20 MG PO TABS: 20.0000 mg | ORAL_TABLET | Freq: Once | ORAL | Status: AC

## 2020-07-24 ENCOUNTER — Ambulatory Visit
Admission: RE | Admit: 2020-07-24 | Discharge: 2020-07-24 | Disposition: A | Discharge status: Home / Self Care | Payer: BC Managed Care – PPO | Attending: Surgery | Admitting: Surgery

## 2020-07-24 ENCOUNTER — Ambulatory Visit: Payer: BC Managed Care – PPO | Admitting: Certified Registered Nurse Anesthetist

## 2020-07-24 ENCOUNTER — Encounter: Payer: Self-pay | Admitting: Surgery

## 2020-07-24 ENCOUNTER — Other Ambulatory Visit: Payer: Self-pay

## 2020-07-24 ENCOUNTER — Encounter
Admission: RE | Disposition: A | Discharge status: Home / Self Care | Payer: Self-pay | Source: Home / Self Care | Attending: Surgery

## 2020-07-24 DIAGNOSIS — K4020 Bilateral inguinal hernia, without obstruction or gangrene, not specified as recurrent: Secondary | ICD-10-CM | POA: Diagnosis not present

## 2020-07-24 DIAGNOSIS — Z79899 Other long term (current) drug therapy: Secondary | ICD-10-CM | POA: Insufficient documentation

## 2020-07-24 DIAGNOSIS — Z20822 Contact with and (suspected) exposure to covid-19: Secondary | ICD-10-CM | POA: Insufficient documentation

## 2020-07-24 DIAGNOSIS — K4090 Unilateral inguinal hernia, without obstruction or gangrene, not specified as recurrent: Secondary | ICD-10-CM | POA: Diagnosis not present

## 2020-07-24 DIAGNOSIS — K429 Umbilical hernia without obstruction or gangrene: Secondary | ICD-10-CM | POA: Diagnosis not present

## 2020-07-24 HISTORY — PX: UMBILICAL HERNIA REPAIR: SHX196

## 2020-07-24 HISTORY — PX: XI ROBOTIC ASSISTED INGUINAL HERNIA REPAIR WITH MESH: SHX6706

## 2020-07-24 SURGERY — REPAIR, HERNIA, INGUINAL, ROBOT-ASSISTED, LAPAROSCOPIC, USING MESH
Anesthesia: General | Site: Inguinal

## 2020-07-24 MED ORDER — ONDANSETRON HCL 4 MG/2ML IJ SOLN
INTRAMUSCULAR | Status: AC
  Filled 2020-07-24: qty 2

## 2020-07-24 MED ORDER — FENTANYL CITRATE (PF) 100 MCG/2ML IJ SOLN
INTRAMUSCULAR | Status: AC
  Administered 2020-07-24: 50 ug via INTRAVENOUS
  Filled 2020-07-24: qty 2

## 2020-07-24 MED ORDER — EPHEDRINE SULFATE 50 MG/ML IJ SOLN
INTRAMUSCULAR | Status: DC | PRN
  Administered 2020-07-24: 10 mg via INTRAVENOUS
  Administered 2020-07-24: 5 mg via INTRAVENOUS
  Administered 2020-07-24: 10 mg via INTRAVENOUS
  Administered 2020-07-24 (×5): 5 mg via INTRAVENOUS

## 2020-07-24 MED ORDER — ACETAMINOPHEN 500 MG PO TABS
ORAL_TABLET | ORAL | Status: AC
  Administered 2020-07-24: 1000 mg via ORAL
  Filled 2020-07-24: qty 1

## 2020-07-24 MED ORDER — OXYCODONE HCL 5 MG PO TABS: 5.0000 mg | ORAL_TABLET | ORAL | 0 refills | Status: DC | PRN

## 2020-07-24 MED ORDER — KETOROLAC TROMETHAMINE 30 MG/ML IJ SOLN
INTRAMUSCULAR | Status: AC
  Filled 2020-07-24: qty 1

## 2020-07-24 MED ORDER — IBUPROFEN 600 MG PO TABS: 600.0000 mg | ORAL_TABLET | Freq: Three times a day (TID) | ORAL | 1 refills | Status: AC | PRN

## 2020-07-24 MED ORDER — ACETAMINOPHEN 500 MG PO TABS: 1000.0000 mg | ORAL_TABLET | Freq: Four times a day (QID) | ORAL | Status: AC | PRN

## 2020-07-24 MED ORDER — BUPIVACAINE LIPOSOME 1.3 % IJ SUSP
INTRAMUSCULAR | Status: DC | PRN
  Administered 2020-07-24: 20 mL

## 2020-07-24 MED ORDER — KETOROLAC TROMETHAMINE 30 MG/ML IJ SOLN
INTRAMUSCULAR | Status: DC | PRN
  Administered 2020-07-24: 30 mg via INTRAVENOUS

## 2020-07-24 MED ORDER — SUCCINYLCHOLINE CHLORIDE 200 MG/10ML IV SOSY
PREFILLED_SYRINGE | INTRAVENOUS | Status: AC
  Filled 2020-07-24: qty 10

## 2020-07-24 MED ORDER — FENTANYL CITRATE (PF) 100 MCG/2ML IJ SOLN
INTRAMUSCULAR | Status: DC | PRN
  Administered 2020-07-24 (×2): 50 ug via INTRAVENOUS

## 2020-07-24 MED ORDER — BUPIVACAINE-EPINEPHRINE 0.25% -1:200000 IJ SOLN
INTRAMUSCULAR | Status: DC | PRN
  Administered 2020-07-24: 30 mL

## 2020-07-24 MED ORDER — CHLORHEXIDINE GLUCONATE 0.12 % MT SOLN
OROMUCOSAL | Status: AC
  Administered 2020-07-24: 15 mL via OROMUCOSAL
  Filled 2020-07-24: qty 15

## 2020-07-24 MED ORDER — OXYCODONE HCL 5 MG/5ML PO SOLN: 5.0000 mg | Freq: Once | ORAL | Status: AC | PRN

## 2020-07-24 MED ORDER — OXYCODONE HCL 5 MG PO TABS
ORAL_TABLET | ORAL | Status: AC
  Filled 2020-07-24: qty 1

## 2020-07-24 MED ORDER — FENTANYL CITRATE (PF) 100 MCG/2ML IJ SOLN
INTRAMUSCULAR | Status: AC
  Filled 2020-07-24: qty 2

## 2020-07-24 MED ORDER — GLYCOPYRROLATE 0.2 MG/ML IJ SOLN
INTRAMUSCULAR | Status: AC
  Filled 2020-07-24: qty 1

## 2020-07-24 MED ORDER — MIDAZOLAM HCL 2 MG/2ML IJ SOLN
INTRAMUSCULAR | Status: AC
  Filled 2020-07-24: qty 2

## 2020-07-24 MED ORDER — MIDAZOLAM HCL 2 MG/2ML IJ SOLN
INTRAMUSCULAR | Status: DC | PRN
  Administered 2020-07-24: 2 mg via INTRAVENOUS

## 2020-07-24 MED ORDER — SODIUM CHLORIDE FLUSH 0.9 % IV SOLN
INTRAVENOUS | Status: AC
  Filled 2020-07-24: qty 10

## 2020-07-24 MED ORDER — BUPIVACAINE-EPINEPHRINE (PF) 0.25% -1:200000 IJ SOLN
INTRAMUSCULAR | Status: AC
  Filled 2020-07-24: qty 30

## 2020-07-24 MED ORDER — DEXAMETHASONE SODIUM PHOSPHATE 10 MG/ML IJ SOLN
INTRAMUSCULAR | Status: AC
  Filled 2020-07-24: qty 1

## 2020-07-24 MED ORDER — PHENYLEPHRINE HCL (PRESSORS) 10 MG/ML IV SOLN
INTRAVENOUS | Status: AC
  Filled 2020-07-24: qty 1

## 2020-07-24 MED ORDER — PROPOFOL 10 MG/ML IV BOLUS
INTRAVENOUS | Status: DC | PRN
  Administered 2020-07-24: 120 mg via INTRAVENOUS

## 2020-07-24 MED ORDER — DEXAMETHASONE SODIUM PHOSPHATE 10 MG/ML IJ SOLN
INTRAMUSCULAR | Status: DC | PRN
  Administered 2020-07-24: 6 mg via INTRAVENOUS

## 2020-07-24 MED ORDER — ROCURONIUM BROMIDE 10 MG/ML (PF) SYRINGE
PREFILLED_SYRINGE | INTRAVENOUS | Status: AC
  Filled 2020-07-24: qty 10

## 2020-07-24 MED ORDER — SUGAMMADEX SODIUM 200 MG/2ML IV SOLN
INTRAVENOUS | Status: DC | PRN
  Administered 2020-07-24: 162.4 mg via INTRAVENOUS

## 2020-07-24 MED ORDER — BUPIVACAINE HCL (PF) 0.5 % IJ SOLN
INTRAMUSCULAR | Status: AC
  Filled 2020-07-24: qty 30

## 2020-07-24 MED ORDER — FENTANYL CITRATE (PF) 100 MCG/2ML IJ SOLN
25.0000 ug | INTRAMUSCULAR | Status: DC | PRN
  Administered 2020-07-24: 50 ug via INTRAVENOUS

## 2020-07-24 MED ORDER — GABAPENTIN 100 MG PO CAPS
ORAL_CAPSULE | ORAL | Status: AC
  Administered 2020-07-24: 200 mg via ORAL
  Filled 2020-07-24: qty 2

## 2020-07-24 MED ORDER — GABAPENTIN 300 MG PO CAPS
ORAL_CAPSULE | ORAL | Status: AC
  Filled 2020-07-24: qty 1

## 2020-07-24 MED ORDER — FAMOTIDINE 20 MG PO TABS
ORAL_TABLET | ORAL | Status: AC
  Administered 2020-07-24: 20 mg via ORAL
  Filled 2020-07-24: qty 1

## 2020-07-24 MED ORDER — LIDOCAINE HCL (PF) 2 % IJ SOLN
INTRAMUSCULAR | Status: AC
  Filled 2020-07-24: qty 5

## 2020-07-24 MED ORDER — OXYCODONE HCL 5 MG PO TABS
5.0000 mg | ORAL_TABLET | Freq: Once | ORAL | Status: AC | PRN
  Administered 2020-07-24: 5 mg via ORAL

## 2020-07-24 MED ORDER — LIDOCAINE HCL (CARDIAC) PF 100 MG/5ML IV SOSY
PREFILLED_SYRINGE | INTRAVENOUS | Status: DC | PRN
  Administered 2020-07-24: 80 mg via INTRAVENOUS

## 2020-07-24 MED ORDER — BUPIVACAINE LIPOSOME 1.3 % IJ SUSP
INTRAMUSCULAR | Status: AC
  Filled 2020-07-24: qty 20

## 2020-07-24 MED ORDER — PHENYLEPHRINE HCL (PRESSORS) 10 MG/ML IV SOLN
INTRAVENOUS | Status: DC | PRN
  Administered 2020-07-24: 50 ug via INTRAVENOUS
  Administered 2020-07-24: 100 ug via INTRAVENOUS
  Administered 2020-07-24: 50 ug via INTRAVENOUS
  Administered 2020-07-24: 100 ug via INTRAVENOUS

## 2020-07-24 MED ORDER — ONDANSETRON HCL 4 MG/2ML IJ SOLN: 4.0000 mg | Freq: Once | INTRAMUSCULAR | Status: DC | PRN

## 2020-07-24 MED ORDER — CEFAZOLIN SODIUM-DEXTROSE 2-4 GM/100ML-% IV SOLN
INTRAVENOUS | Status: AC
  Filled 2020-07-24: qty 100

## 2020-07-24 MED ORDER — PROPOFOL 10 MG/ML IV BOLUS
INTRAVENOUS | Status: AC
  Filled 2020-07-24: qty 40

## 2020-07-24 MED ORDER — ROCURONIUM BROMIDE 100 MG/10ML IV SOLN
INTRAVENOUS | Status: DC | PRN
  Administered 2020-07-24: 30 mg via INTRAVENOUS
  Administered 2020-07-24: 40 mg via INTRAVENOUS
  Administered 2020-07-24: 60 mg via INTRAVENOUS

## 2020-07-24 MED ORDER — ONDANSETRON HCL 4 MG/2ML IJ SOLN
INTRAMUSCULAR | Status: DC | PRN
  Administered 2020-07-24: 4 mg via INTRAVENOUS

## 2020-07-24 SURGICAL SUPPLY — 70 items
ADH SKN CLS APL DERMABOND .7 (GAUZE/BANDAGES/DRESSINGS) ×2
APL PRP STRL LF DISP 70% ISPRP (MISCELLANEOUS) ×2
BAG SPEC RTRVL LRG 6X4 10 (ENDOMECHANICALS) ×2
BLADE SURG 15 STRL LF DISP TIS (BLADE) ×2 IMPLANT
BLADE SURG 15 STRL SS (BLADE) ×3
CANISTER SUCT 1200ML W/VALVE (MISCELLANEOUS) IMPLANT
CANNULA REDUC XI 12-8 STAPL (CANNULA) ×1
CANNULA REDUCER 12-8 DVNC XI (CANNULA) ×2 IMPLANT
CHLORAPREP W/TINT 26 (MISCELLANEOUS) ×3 IMPLANT
COVER TIP SHEARS 8 DVNC (MISCELLANEOUS) ×2 IMPLANT
COVER TIP SHEARS 8MM DA VINCI (MISCELLANEOUS) ×1
COVER WAND RF STERILE (DRAPES) IMPLANT
DEFOGGER SCOPE WARMER CLEARIFY (MISCELLANEOUS) ×3 IMPLANT
DERMABOND ADVANCED (GAUZE/BANDAGES/DRESSINGS) ×1
DERMABOND ADVANCED .7 DNX12 (GAUZE/BANDAGES/DRESSINGS) ×2 IMPLANT
DRAPE ARM DVNC X/XI (DISPOSABLE) ×8 IMPLANT
DRAPE COLUMN DVNC XI (DISPOSABLE) ×2 IMPLANT
DRAPE DA VINCI XI ARM (DISPOSABLE) ×4
DRAPE DA VINCI XI COLUMN (DISPOSABLE) ×1
DRAPE LAPAROTOMY 77X122 PED (DRAPES) ×3 IMPLANT
ELECT CAUTERY BLADE 6.4 (BLADE) ×3 IMPLANT
ELECT REM PT RETURN 9FT ADLT (ELECTROSURGICAL) ×3
ELECTRODE REM PT RTRN 9FT ADLT (ELECTROSURGICAL) ×2 IMPLANT
GLOVE SURG SYN 7.0 (GLOVE) ×6 IMPLANT
GLOVE SURG SYN 7.5  E (GLOVE) ×2
GLOVE SURG SYN 7.5 E (GLOVE) ×4 IMPLANT
GOWN STRL REUS W/ TWL LRG LVL3 (GOWN DISPOSABLE) ×8 IMPLANT
GOWN STRL REUS W/TWL LRG LVL3 (GOWN DISPOSABLE) ×12
IRRIGATION STRYKERFLOW (MISCELLANEOUS) IMPLANT
IRRIGATOR STRYKERFLOW (MISCELLANEOUS)
IV NS 1000ML (IV SOLUTION)
IV NS 1000ML BAXH (IV SOLUTION) IMPLANT
KIT PINK PAD W/HEAD ARE REST (MISCELLANEOUS) ×3
KIT PINK PAD W/HEAD ARM REST (MISCELLANEOUS) ×2 IMPLANT
LABEL OR SOLS (LABEL) ×3 IMPLANT
MANIFOLD NEPTUNE II (INSTRUMENTS) ×3 IMPLANT
MESH 3DMAX 4X6 LT LRG (Mesh General) ×1 IMPLANT
MESH 3DMAX 4X6 RT LRG (Mesh General) ×1 IMPLANT
MESH 3DMAX MID 4X6 LT LRG (Mesh General) ×2 IMPLANT
MESH 3DMAX MID 4X6 RT LRG (Mesh General) ×2 IMPLANT
NEEDLE HYPO 22GX1.5 SAFETY (NEEDLE) ×3 IMPLANT
NEEDLE INSUFFLATION 14GA 120MM (NEEDLE) ×3 IMPLANT
NS IRRIG 500ML POUR BTL (IV SOLUTION) ×3 IMPLANT
OBTURATOR OPTICAL STANDARD 8MM (TROCAR) ×1
OBTURATOR OPTICAL STND 8 DVNC (TROCAR) ×2
OBTURATOR OPTICALSTD 8 DVNC (TROCAR) ×2 IMPLANT
PACK BASIN MINOR ARMC (MISCELLANEOUS) ×3 IMPLANT
PACK LAP CHOLECYSTECTOMY (MISCELLANEOUS) ×3 IMPLANT
PENCIL ELECTRO HAND CTR (MISCELLANEOUS) ×3 IMPLANT
POUCH SPECIMEN RETRIEVAL 10MM (ENDOMECHANICALS) ×3 IMPLANT
SEAL CANN UNIV 5-8 DVNC XI (MISCELLANEOUS) ×6 IMPLANT
SEAL XI 5MM-8MM UNIVERSAL (MISCELLANEOUS) ×3
SET TUBE SMOKE EVAC HIGH FLOW (TUBING) ×3 IMPLANT
SOLUTION ELECTROLUBE (MISCELLANEOUS) ×3 IMPLANT
SPONGE LAP 18X18 RF (DISPOSABLE) ×3 IMPLANT
STAPLER CANNULA SEAL DVNC XI (STAPLE) ×2 IMPLANT
STAPLER CANNULA SEAL XI (STAPLE) ×1
SUT ETHIBOND 0 MO6 C/R (SUTURE) ×3 IMPLANT
SUT MNCRL AB 4-0 PS2 18 (SUTURE) ×3 IMPLANT
SUT VIC AB 2-0 SH 27 (SUTURE) ×6
SUT VIC AB 2-0 SH 27XBRD (SUTURE) ×4 IMPLANT
SUT VIC AB 3-0 SH 27 (SUTURE) ×3
SUT VIC AB 3-0 SH 27X BRD (SUTURE) ×2 IMPLANT
SUT VICRYL 0 AB UR-6 (SUTURE) ×6 IMPLANT
SUT VLOC 90 S/L VL9 GS22 (SUTURE) ×6 IMPLANT
SYR 20ML LL LF (SYRINGE) ×3 IMPLANT
SYR BULB IRRIG 60ML STRL (SYRINGE) ×3 IMPLANT
TAPE TRANSPORE STRL 2 31045 (GAUZE/BANDAGES/DRESSINGS) ×3 IMPLANT
TRAY FOLEY SLVR 16FR LF STAT (SET/KITS/TRAYS/PACK) IMPLANT
TROCAR BALLN GELPORT 12X130M (ENDOMECHANICALS) ×3 IMPLANT

## 2020-07-24 NOTE — Anesthesia Procedure Notes (Signed)
Procedure Name: Intubation Performed by: Irving Burton, CRNA Pre-anesthesia Checklist: Patient identified, Patient being monitored, Timeout performed, Emergency Drugs available and Suction available Patient Re-evaluated:Patient Re-evaluated prior to induction Oxygen Delivery Method: Circle system utilized Preoxygenation: Pre-oxygenation with 100% oxygen Induction Type: IV induction Ventilation: Mask ventilation without difficulty Laryngoscope Size: McGraph and 4 Grade View: Grade I Tube type: Oral Tube size: 7.0 mm Number of attempts: 1 Airway Equipment and Method: Stylet and Video-laryngoscopy Placement Confirmation: ETT inserted through vocal cords under direct vision,  positive ETCO2 and breath sounds checked- equal and bilateral Secured at: 21 cm Tube secured with: Tape Dental Injury: Teeth and Oropharynx as per pre-operative assessment

## 2020-07-24 NOTE — Op Note (Signed)
Procedure Date:  07/24/2020  Pre-operative Diagnosis:   Left inguinal hernia and umbilical hernia  Post-operative Diagnosis:  Bilateral inguinal hernia and umbilical hernia  Procedure: 1.  Robotic assisted Bilateral Inguinal Hernia Repair 2.  Creation of Bilateral Posterior Rectus-Transversalis Fascia Advancment Flap for Coverage of Pelvic Wound (200 cm) 3.  Open umbilical hernia repair.  Surgeon:  Howie Ill, MD  Anesthesia:  General endotracheal  Estimated Blood Loss:  10 ml  Specimens:  None  Complications:  None  Indications for Procedure:  This is a 65 y.o. male who presents with a left inguinal hernia and umbilical hernia.  The options of surgery versus observation were reviewed with the patient and/or family. The risks of bleeding, abscess or infection, recurrence of symptoms, potential for an open procedure, injury to surrounding structures, and chronic pain were all discussed with the patient and was he willing to proceed.  We have planned this transabdominal procedure with the creation of left peritoneal flap based on the posterior rectus sheath and transversalis fascia in order to fully cover the mesh, creating a natural tisssue barrier for the bowel and peritoneal cavity.  Description of Procedure: The patient was correctly identified in the preoperative area and brought into the operating room.  The patient was placed supine with VTE prophylaxis in place.  Appropriate time-outs were performed.  Anesthesia was induced and the patient was intubated.  Foley catheter was placed.  Appropriate antibiotics were infused.  The abdomen was prepped and draped in a sterile fashion. A supraumbilical incision was made. Cautery was used to dissect down the umbilical stalk.  The stalk was detached from the fascia exposing a 1 cm hernia defect.  The fascial edges were cleared with cautery and hernia sac was excised.  A 12 mm robotic trocar was inserted.  Pneumoperitoneum was obtained  with appropriate opening pressures.  It was noted that the patient had bilateral inguinal hernias. A Veress needle was used to start dissecting the peritoneal flap and this was done on both sides.  Two 8-mm robotic ports were placed in the right and left lateral positions under direct visualization.  A right and left 3D Max Mid Bard Mesh, a 2-0 Vicryl, and two 2-0 vloc sutures were placed through the side ports under direct visualization.  The [employer] platform was docked onto the patient, the camera was inserted and targeted, and the instruments were placed under direct visualization.  Both inguinal regions were inspected for hernias and it was confirmed that the patient had bilateral inguinal hernias.  On the left, he appeared to have a direct hernia and on the right, a small indirect inguinal hernia.  We started on the right side.  Using electocautery, the peritoneal and posterior rectus tissue flap was created.  The peritoneum on the right side was scored from the median umbilical ligament laterally towards the ASIS.  The flap was mobilized using robotic scissors and the bipolar instruments, creating a plane along the posterior rectus sheath and transversalis fascia down to the pubic tubercle medially. It was then further mobilized laterally across the inguinal canal and femoral vessels and onto the psoas muscle. The inferior epigastric vessels were identified and preserved. This created a posterior rectus and peritoneal flap measuring roughly 17 cm x 12 cm.  The hernia sac and contents were reduced preserving all structures.  A cord lipoma was resected.    We then proceeded to the left side.  In similar fashion, the peritoneal and posterior rectus tissue flap was  created.  The peritoneum on the left side was scored from the median umbilical ligament laterally towards the ASIS.  The flap was mobilized using robotic scissors and the bipolar instruments, creating a plane along the posterior rectus sheath and  transversalis fascia down to the pubic tubercle medially. It was then further mobilized laterally across the inguinal canal and femoral vessels and onto the psoas muscle. The inferior epigastric vessels were identified and preserved. This created a posterior rectus and peritoneal flap measuring roughly 17 cm x 12 cm.  The hernia sac and contents were reduced preserving all structures.  The contents consisted of preperitoneal fat which was resected.  We then proceeded with mesh placement.  A right large Bard 3D Max Mid mesh was placed with good overlap along all the potential hernia defects and secured in place with 2-0 Vicryl along the medial superomedial and superolateral aspects.  Then, a left large Bard 3D Max Mid mesh was placed with good overlap along all the potential hernia defects and secured in place with 2-0 Vicryl along the medial superomedial and superolateral aspects.  Then, the peritoneal flaps was advanced over the mesh and carried over to close the defect bilaterally. A running 2-0 V lock suture was used to approximate the edge of the flap onto the peritoneum on both sides.  All needles were removed under direct visualization.  The [employer]inc[employer] platform was undocked.  The cord lipoma and preperitoneal fat were placed in an Endocatch bag and taken out from the 12 mm port.  The 8- mm ports were removed under direct visualization and the Hasson trocar was removed.  The endocatch bag was taken out.  The hernia defect was then closed using 0 vicryl sutures.  Local anesthetic was infused in all incisions as well as a bilateral ilioinguinal block.  The umbilical stalk was reattached to the fascia using 2-0 Vicryl sutures, and the incision was closed in layers using 3-0 Vicryl and 4-0 Monocryl.  The remaining incisions were closed with 4-0 Monocryl.  The wounds were cleaned and sealed with DermaBond.  Foley catheter was removed and the patient was emerged from anesthesia and extubated and brought to the  recovery room for further management.  The patient tolerated the procedure well and all counts were correct at the end of the case.   Howie Ill, MD

## 2020-07-24 NOTE — H&P (Signed)
07/24/20  Reason for Visit:  Umbilical hernia and left inguinal hernia  Referring Provider:  Elizabeth Sauer, MD  History of Present Illness: Jason [name] is a 65 y.o. male presenting for evaluation of a left inguinal hernia.  The patient reports that he's had this since about 2019 and feels that it's been getting bigger.  He was actually referred to our group in 2020 but did not show up for the appointment.  Overall feels discomfort when he bends down in the left groin, and notices the bulging sensation when he's doing strenuous activity at work.  However, when he relaxes, the bulging subsides.  Denies any issues on the right groin.  Denies any constipation, diarrhea, nausea, or vomiting.  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  He is able to do his work without shortness of breath, but does report having some episodes of chest pain, but he attributes it to muscular issues instead.  Denies any cardiac issues in the past and the pain does not radiate to the arm or jaw.    Past Medical History:     Past Medical History:  Diagnosis Date  . Hypertension      Past Surgical History: --Left shoulder / rotator cuff surgery.  Home Medications:        Prior to Admission medications   Medication Sig Start Date End Date Taking? Authorizing Provider  doxazosin (CARDURA) 1 MG tablet Take 1 tablet (1 mg total) by mouth daily. 05/23/20   Duanne Limerick, MD  doxycycline (VIBRA-TABS) 100 MG tablet Take 1 tablet (100 mg total) by mouth 2 (two) times daily. 05/23/20   Duanne Limerick, MD  valsartan-hydrochlorothiazide (DIOVAN-HCT) 160-25 MG tablet Take 1 tablet by mouth daily. 05/23/20   Duanne Limerick, MD    Allergies: No Known Allergies  Social History:  reports that he has never smoked. He has never used smokeless tobacco. He reports that he does not drink alcohol and does not use drugs.   Family History:      Family History  Problem Relation Age of Onset  . Diabetes Mother   .  Prostatitis Father   . Prostatitis Paternal Aunt     Review of Systems: Review of Systems  Constitutional: Negative for chills and fever.  HENT: Negative for hearing loss.   Respiratory: Negative for shortness of breath.   Cardiovascular: Positive for chest pain.  Gastrointestinal: Positive for abdominal pain (left groin discomfort at times). Negative for constipation, diarrhea, nausea and vomiting.  Genitourinary: Negative for dysuria.  Musculoskeletal: Positive for myalgias (chest pain).  Skin: Negative for rash.  Neurological: Negative for dizziness.  Psychiatric/Behavioral: Negative for depression.    Physical Exam BP (!) 176/100   Pulse 70   Temp 98.9 F (37.2 C) (Oral)   Ht 5\' 7"  (1.702 m)   Wt 185 lb 3.2 oz (84 kg)   SpO2 98%   BMI 29.01 kg/m  CONSTITUTIONAL: No acute distress HEENT:  Normocephalic, atraumatic, extraocular motion intact. NECK: Trachea is midline, and there is no jugular venous distension.  RESPIRATORY:  Lungs are clear bilaterally.  Normal respiratory effort without pathologic use of accessory muscles. CARDIOVASCULAR: Regular rhythm and rate without murmurs. GI: The abdomen is soft, non-distended, non-tender.  The patient has a small reducible left inguinal hernia and a small reducible umbilical hernia.  There is no palpable hernia in the right groin.  MUSCULOSKELETAL:  Normal muscle strength and tone in all four extremities.  No peripheral edema or cyanosis. SKIN:  Skin turgor is normal. There are no pathologic skin lesions.  NEUROLOGIC:  Motor and sensation is grossly normal.  Cranial nerves are grossly intact. PSYCH:  Alert and oriented to person, place and time. Affect is normal.  Laboratory Analysis: Lab Results Last 24 Hours  No results found for this or any previous visit (from the past 24 hour(s)).    Imaging: No results found.  Assessment and Plan: This is a 65 y.o. male with a left inguinal and umbilical  hernias.  --Discussed with the patient that he has a left inguinal hernia and also an umbilical hernia.  Discussed how these form and that there is no medical treatment to close them.  He has not had any intra-abdominal surgeries before, and would be a good candidate to robotic approach.  Reviewed with him the minimally invasive hernia repair approach at length, including risks of bleeding, infection, injury to surrounding structures, and the risk of chronic pain.  The umbilical hernia would be repaired during the same procedure, and if there is a right inguinal hernia noted, it can be repaired at the same time as well.  He is willing to proceed.  He understands that he would need a COVID test prior to surgery. --Will schedule him for 07/24/20.   Howie Ill, MD [facility]

## 2020-07-24 NOTE — Anesthesia Preprocedure Evaluation (Signed)
Anesthesia Evaluation  Patient identified by MRN, date of birth, ID band Patient awake    Reviewed: Allergy & Precautions, NPO status , Patient's Chart, lab work & pertinent test results  History of Anesthesia Complications Negative for: history of anesthetic complications  Airway Mallampati: II  TM Distance: >3 FB Neck ROM: Full    Dental  (+) Missing, Poor Dentition   Pulmonary neg pulmonary ROS, neg sleep apnea, neg COPD, Patient abstained from smoking.Not current smoker,    Pulmonary exam normal breath sounds clear to auscultation       Cardiovascular Exercise Tolerance: Good METShypertension, (-) CAD and (-) Past MI (-) dysrhythmias  Rhythm:Regular Rate:Normal - Systolic murmurs    Neuro/Psych negative neurological ROS  negative psych ROS   GI/Hepatic neg GERD  ,(+)     (-) substance abuse  ,   Endo/Other  neg diabetes  Renal/GU negative Renal ROS     Musculoskeletal   Abdominal   Peds  Hematology   Anesthesia Other Findings Past Medical History: No date: Asthma     Comment:  as a child  No date: Hypertension  Reproductive/Obstetrics                             Anesthesia Physical Anesthesia Plan  ASA: II  Anesthesia Plan: General   Post-op Pain Management:    Induction: Intravenous  PONV Risk Score and Plan: 3 and Ondansetron, Dexamethasone and Midazolam  Airway Management Planned: Oral ETT  Additional Equipment: None  Intra-op Plan:   Post-operative Plan: Extubation in OR  Informed Consent: I have reviewed the patients History and Physical, chart, labs and discussed the procedure including the risks, benefits and alternatives for the proposed anesthesia with the patient or authorized representative who has indicated his/her understanding and acceptance.     Dental advisory given  Plan Discussed with: CRNA and Surgeon  Anesthesia Plan Comments: (Discussed  risks of anesthesia with patient, including PONV, sore throat, lip/dental damage. Rare risks discussed as well, such as cardiorespiratory and neurological sequelae. Patient understands.)        Anesthesia Quick Evaluation

## 2020-07-24 NOTE — Anesthesia Postprocedure Evaluation (Signed)
Anesthesia Post Note  Patient: [name]  Procedure(s) Performed: XI ROBOTIC ASSISTED INGUINAL HERNIA REPAIR WITH MESH (Left Inguinal) HERNIA REPAIR UMBILICAL ADULT, open (N/A Abdomen)  Patient location during evaluation: PACU Anesthesia Type: General Level of consciousness: awake and alert Pain management: pain level controlled Vital Signs Assessment: post-procedure vital signs reviewed and stable Respiratory status: spontaneous breathing, nonlabored ventilation, respiratory function stable and patient connected to nasal cannula oxygen Cardiovascular status: blood pressure returned to baseline and stable Postop Assessment: no apparent nausea or vomiting Anesthetic complications: no   No complications documented.   Last Vitals:  Vitals:   07/24/20 1305 07/24/20 1315  BP:  115/84  Pulse: 87 85  Resp: 11 12  Temp:    SpO2: 96% 94%    Last Pain:  Vitals:   07/24/20 1315  TempSrc:   PainSc: 5                  Jason [name]

## 2020-07-24 NOTE — Transfer of Care (Signed)
Immediate Anesthesia Transfer of Care Note  Patient: Jason [name]  Procedure(s) Performed: XI ROBOTIC ASSISTED INGUINAL HERNIA REPAIR WITH MESH (Left Inguinal) HERNIA REPAIR UMBILICAL ADULT, open (N/A Abdomen)  Patient Location: PACU  Anesthesia Type:General  Level of Consciousness: awake and alert   Airway & Oxygen Therapy: Patient Spontanous Breathing and Patient connected to nasal cannula oxygen  Post-op Assessment: Report given to RN and Post -op Vital signs reviewed and stable  Post vital signs: Reviewed and stable  Last Vitals:  Vitals Value Taken Time  BP 126/83   Temp    Pulse 86 07/24/20 1213  Resp    SpO2 99 % 07/24/20 1213  Vitals shown include unvalidated device data.  Last Pain:  Vitals:   07/24/20 0734  TempSrc: Tympanic  PainSc: 0-No pain      Patients Stated Pain Goal: 0 (07/24/20 0734)  Complications: No complications documented.

## 2020-07-24 NOTE — Discharge Instructions (Addendum)
Inguinal Hernia, Adult An inguinal hernia develops when fat or the intestines push through a weak spot in a muscle where the leg meets the lower abdomen (groin). This creates a bulge. This kind of hernia could also be:  In the scrotum, if you are male.  In folds of skin around the vagina, if you are male. There are three types of inguinal hernias:  Hernias that can be pushed back into the abdomen (are reducible). This type rarely causes pain.  Hernias that are not reducible (are incarcerated).  Hernias that are not reducible and lose their blood supply (are strangulated). This type of hernia requires emergency surgery. What are the causes? This condition is caused by having a weak spot in the muscles or tissues in your groin. This develops over time. The hernia may poke through the weak spot when you suddenly strain your lower abdominal muscles, such as when you:  Lift a heavy object.  Strain to have a bowel movement. Constipation can lead to straining.  Cough. What increases the risk? This condition is more likely to develop in:  Males.  Pregnant females.  People who: ? Are overweight. ? Work in jobs that require long periods of standing or heavy lifting. ? Have had an inguinal hernia before. ? Smoke or have lung disease. These factors can lead to long-term (chronic) coughing. What are the signs or symptoms? Symptoms may depend on the size of the hernia. Often, a small inguinal hernia has no symptoms. Symptoms of a larger hernia may include:  A bulge in the groin area. This is easier to see when standing. It might not be visible when lying down.  Pain or burning in the groin. This may get worse when lifting, straining, or coughing.  A dull ache or a feeling of pressure in the groin.  An unusual bulge in the scrotum, in males. Symptoms of a strangulated inguinal hernia may include:  A bulge in your groin that is very painful and tender to the touch.  A bulge that  turns red or purple.  Fever, nausea, and vomiting.  Inability to have a bowel movement or to pass gas. How is this diagnosed? This condition is diagnosed based on your symptoms, your medical history, and a physical exam. Your health care provider may feel your groin area and ask you to cough. How is this treated? Treatment depends on the size of your hernia and whether you have symptoms. If you do not have symptoms, your health care provider may have you watch your hernia carefully and have you come in for follow-up visits. If your hernia is large or if you have symptoms, you may need surgery to repair the hernia. Follow these instructions at home: Lifestyle  Avoid lifting heavy objects.  Avoid standing for long periods of time.  Do not use any products that contain nicotine or tobacco. These products include cigarettes, chewing tobacco, and vaping devices, such as e-cigarettes. If you need help quitting, ask your health care provider.  Maintain a healthy weight. Preventing constipation You may need to take these actions to prevent or treat constipation:  Drink enough fluid to keep your urine pale yellow.  Take over-the-counter or prescription medicines.  Eat foods that are high in fiber, such as beans, whole grains, and fresh fruits and vegetables.  Limit foods that are high in fat and processed sugars, such as fried or sweet foods. General instructions  You may try to push the hernia back in place by very  gently pressing on it while lying down. Do not try to force the bulge back in if it will not push in easily.  Watch your hernia for any changes in shape, size, or color. Get help right away if you notice any changes.  Take over-the-counter and prescription medicines only as told by your health care provider.  Keep all follow-up visits. This is important. Contact a health care provider if:  You have a fever or chills.  You develop new symptoms.  Your symptoms get  worse. Get help right away if:  You have pain in your groin that suddenly gets worse.  You have a bulge in your groin that: ? Suddenly gets bigger and does not get smaller. ? Becomes red or purple or painful to the touch.  You are a man and you have a sudden pain in your scrotum, or the size of your scrotum suddenly changes.  You cannot push the hernia back in place by very gently pressing on it when you are lying down.  You have nausea or vomiting that does not go away.  You have a fast heartbeat.  You cannot have a bowel movement or pass gas. These symptoms may represent a serious problem that is an emergency. Do not wait to see if the symptoms will go away. Get medical help right away. Call your local emergency services (911 in the [place] Summary  An inguinal hernia develops when fat or the intestines push through a weak spot in a muscle where your leg meets your lower abdomen (groin).  This condition is caused by having a weak spot in muscles or tissues in your groin.  Symptoms may depend on the size of the hernia, and they may include pain or swelling in your groin. A small inguinal hernia often has no symptoms.  Treatment may not be needed if you do not have symptoms. If you have symptoms or a large hernia, you may need surgery to repair the hernia.  Avoid lifting heavy objects. Also, avoid standing for long periods of time. This information is not intended to replace advice given to you by your health care provider. Make sure you discuss any questions you have with your health care provider. Document Revised: 12/25/2019 Document Reviewed: 12/25/2019 Elsevier Patient Education  2021 [employer] Inc. Bupivacaine Liposomal Suspension for Injection  -  WEAR GREEN BRACELET FOR 3 DAYS  What is this medicine? BUPIVACAINE LIPOSOMAL (bue PIV a kane LIP oh som al) is an anesthetic. It causes loss of feeling in the skin or other tissues. It is used to prevent and to treat pain from some  procedures. This medicine may be used for other purposes; ask your health care provider or pharmacist if you have questions. COMMON BRAND NAME(S): EXPAREL What should I tell my health care provider before I take this medicine? They need to know if you have any of these conditions:  G6PD deficiency  heart disease  kidney disease  liver disease  low blood pressure  lung or breathing disease, like asthma  an unusual or allergic reaction to bupivacaine, other medicines, foods, dyes, or preservatives  pregnant or trying to get pregnant  breast-feeding How should I use this medicine? This medicine is injected into the affected area. It is given by a health care provider in a hospital or clinic setting. Talk to your health care provider about the use of this medicine in children. While it may be given to children as young as 6 years for selected conditions,  precautions do apply. Overdosage: If you think you have taken too much of this medicine contact a poison control center or emergency room at once. NOTE: This medicine is only for you. Do not share this medicine with others. What if I miss a dose? This does not apply. What may interact with this medicine? This medicine may interact with the following medications:  acetaminophen  certain antibiotics like dapsone, nitrofurantoin, aminosalicylic acid, sulfonamides  certain medicines for seizures like phenobarbital, phenytoin, valproic acid  chloroquine  cyclophosphamide  flutamide  hydroxyurea  ifosfamide  metoclopramide  nitric oxide  nitroglycerin  nitroprusside  nitrous oxide  other local anesthetics like lidocaine, pramoxine, tetracaine  primaquine  quinine  rasburicase  sulfasalazine This list may not describe all possible interactions. Give your health care provider a list of all the medicines, herbs, non-prescription drugs, or dietary supplements you use. Also tell them if you smoke, drink alcohol,  or use illegal drugs. Some items may interact with your medicine. What should I watch for while using this medicine? Your condition will be monitored carefully while you are receiving this medicine. Be careful to avoid injury while the area is numb, and you are not aware of pain. What side effects may I notice from receiving this medicine? Side effects that you should report to your doctor or health care professional as soon as possible:  allergic reactions like skin rash, itching or hives, swelling of the face, lips, or tongue  seizures  signs and symptoms of a dangerous change in heartbeat or heart rhythm like chest pain; dizziness; fast, irregular heartbeat; palpitations; feeling faint or lightheaded; falls; breathing problems  signs and symptoms of methemoglobinemia such as pale, gray, or blue colored skin; headache; fast heartbeat; shortness of breath; feeling faint or lightheaded, falls; tiredness Side effects that usually do not require medical attention (report to your doctor or health care professional if they continue or are bothersome):  anxious  back pain  changes in taste  changes in vision  constipation  dizziness  fever  nausea, vomiting This list may not describe all possible side effects. Call your doctor for medical advice about side effects. You may report side effects to FDA at 1-800-FDA-1088. Where should I keep my medicine? This drug is given in a hospital or clinic and will not be stored at home. NOTE: This sheet is a summary. It may not cover all possible information. If you have questions about this medicine, talk to your doctor, pharmacist, or health care provider.  2021 Elsevier/Gold Standard (2019-08-02 12:24:57)   AMBULATORY SURGERY  DISCHARGE INSTRUCTIONS   1) The drugs that you were given will stay in your system until tomorrow so for the next 24 hours you should not:  A) Drive an automobile B) Make any legal decisions C) Drink any  alcoholic beverage   2) You may resume regular meals tomorrow.  Today it is better to start with liquids and gradually work up to solid foods.  You may eat anything you prefer, but it is better to start with liquids, then soup and crackers, and gradually work up to solid foods.   3) Please notify your doctor immediately if you have any unusual bleeding, trouble breathing, redness and pain at the surgery site, drainage, fever, or pain not relieved by medication.    4) Additional Instructions:        Please contact your physician with any problems or Same Day Surgery at [phone], Monday through Friday 6 am to 4 pm, or  [place] [facility] number at [phone].

## 2020-07-25 ENCOUNTER — Encounter: Payer: Self-pay | Admitting: Surgery

## 2020-07-25 ENCOUNTER — Telehealth: Payer: Self-pay

## 2020-07-25 NOTE — Telephone Encounter (Signed)
Patient called with several post op questions. Patient instructed that some numbness of the left groin is normal. He was instructed to use Miralax 1-2 times a day for constipation. He was instructed that he would have lifting restrictions for about 6 weeks. He will call back with any questions.

## 2020-07-29 ENCOUNTER — Telehealth: Payer: Self-pay | Admitting: Surgery

## 2020-07-29 NOTE — Telephone Encounter (Signed)
Patient calls and states that he has a lot of soreness around his navel area.  There is no redness, or swelling, no oozing from the surgical site.  He has no fever, chills, no vomiting or diarrhea.  Just states really sore to touch.  He had to stop the Oxycodone as he stated it was "driving him crazy", was not able to sleep at all with that.  He has been alternating between Tylenol and Ibuprofen though.  Please call, thank you.

## 2020-07-29 NOTE — Telephone Encounter (Signed)
Left inguinal hernia repair-patient states he is sore and he does not like the Oxycodone and that he is going to try Tylenol and ibuprofen only for right now- continue to apply ice pack throughout the day and over the next few days to see if this helps with the soreness-denies pain denies redness and no fevers and his bowels are moving good-patient states he is eating prunes. Patient has post operative appointment 08/04/20 but will call the office if things get worse.

## 2020-07-30 ENCOUNTER — Telehealth: Payer: Self-pay | Admitting: *Deleted

## 2020-07-30 ENCOUNTER — Telehealth: Payer: Self-pay | Admitting: Surgery

## 2020-07-30 NOTE — Telephone Encounter (Signed)
Spoke with patient-he denies fever, chills. Feels fine.He was instructed to let us know if he develops fever or redness around the areas.

## 2020-07-30 NOTE — Telephone Encounter (Signed)
Faxed FMLA to [facility] at [phone]

## 2020-07-30 NOTE — Telephone Encounter (Signed)
Pt called w/questions & concerns related to the healing of one of his surgical sites that looks differently than the others.  He is s/p umbilical & inguinal hernia surgery w/Dr. Aleen Campi (07/24/20); all sites aside from his belly button look good, however, there is more drainage & tenderness coming from this location.  The drainage is clear in color & the site is not red or warm to the touch.  He is keeping it covered to help collect the output of fluid & current pain medication is OTC (Tylenol / Ibuprofen) PRN.  Please advise if he should do anything else to assist in the recovery process or if there is anything to be alarmed about.

## 2020-08-04 ENCOUNTER — Encounter: Payer: Self-pay | Admitting: Surgery

## 2020-08-04 ENCOUNTER — Ambulatory Visit (INDEPENDENT_AMBULATORY_CARE_PROVIDER_SITE_OTHER): Payer: BC Managed Care – PPO | Admitting: Surgery

## 2020-08-04 ENCOUNTER — Other Ambulatory Visit: Payer: Self-pay

## 2020-08-04 VITALS — BP 133/86 | HR 66 | Temp 98.5°F | Ht 67.0 in | Wt 179.4 lb

## 2020-08-04 DIAGNOSIS — K429 Umbilical hernia without obstruction or gangrene: Secondary | ICD-10-CM

## 2020-08-04 DIAGNOSIS — K4020 Bilateral inguinal hernia, without obstruction or gangrene, not specified as recurrent: Secondary | ICD-10-CM

## 2020-08-04 NOTE — Patient Instructions (Addendum)
Wait until there no drainage until you summerge into water. You can take a normal bath or shower. If you have any concerns or questions, please feel free to call our office.    Laparoscopic Inguinal Hernia Repair, Adult, Care After The following information offers guidance on how to care for yourself after your procedure. Your health care provider may also give you more specific instructions. If you have problems or questions, contact your health care provider. What can I expect after the procedure? After the procedure, it is common to have:  Pain.  Swelling and bruising around the incision area.  Scrotal swelling, in males.  Some fluid or blood draining from your incisions. Follow these instructions at home: Medicines  Take over-the-counter and prescription medicines only as told by your health care provider.  Ask your health care provider if the medicine prescribed to you: ? Requires you to avoid driving or using machinery. ? Can cause constipation. You may need to take these actions to prevent or treat constipation:  Drink enough fluid to keep your urine pale yellow.  Take over-the-counter or prescription medicines.  Eat foods that are high in fiber, such as beans, whole grains, and fresh fruits and vegetables.  Limit foods that are high in fat and processed sugars, such as fried or sweet foods. Incision care  Follow instructions from your health care provider about how to take care of your incisions. Make sure you: ? Wash your hands with soap and water for at least 20 seconds before and after you change your bandage (dressing). If soap and water are not available, use hand sanitizer. ? Change your dressing as told by your health care provider. ? Leave stitches (sutures), skin glue, or adhesive strips in place. These skin closures may need to stay in place for 2 weeks or longer. If adhesive strip edges start to loosen and curl up, you may trim the loose edges. Do not remove  adhesive strips completely unless your health care provider tells you to do that.  Check your incision area every day for signs of infection. Check for: ? More redness, swelling, or pain. ? More fluid or blood. ? Warmth. ? Pus or a bad smell.  Wear loose, soft clothing while your incisions heal.   Managing pain and swelling If directed, put ice on the painful or swollen areas. To do this:  Put ice in a plastic bag.  Place a towel between your skin and the bag.  Leave the ice on for 20 minutes, 2-3 times a day.  Remove the ice if your skin turns bright red. This is very important. If you cannot feel pain, heat, or cold, you have a greater risk of damage to the area.   Activity  Do not lift anything that is heavier than 10 lb (4.5 kg), or the limit that you are told, until your health care provider says that it is safe.  Ask your health care provider what activities are safe for you. A lot of activity during the first week after surgery can increase pain and swelling. For 1 week after your procedure: ? Avoid activities that take a lot of effort, such as exercise or sports. ? You may walk and climb stairs as needed for daily activity, but avoid long walks or climbing stairs for exercise. General instructions  If you were given a sedative during the procedure, it can affect you for several hours. Do not drive or operate machinery until your health care provider says that  it is safe.  Do not take baths, swim, or use a hot tub until your health care provider approves. Ask your health care provider if you may take showers. You may only be allowed to take sponge baths.  Do not use any products that contain nicotine or tobacco. These products include cigarettes, chewing tobacco, and vaping devices, such as e-cigarettes. If you need help quitting, ask your health care provider.  Keep all follow-up visits. This is important. Contact a health care provider if:  You have any of these signs of  infection: ? More redness, swelling, or pain around your incisions or your groin area. ? More fluid or blood coming from an incision. ? Warmth coming from an incision. ? Pus or a bad smell coming from an incision. ? A fever or chills.  You have more swelling in your scrotum, if you are male.  You have severe pain and medicines do not help.  You have abdominal pain or swelling.  You cannot urinate or have a bowel movement.  You faint or feel dizzy.  You have nausea and vomiting. Get help right away if:  You have redness, warmth, or pain in your leg.  You have chest pain.  You have problems breathing. These symptoms may represent a serious problem that is an emergency. Do not wait to see if the symptoms will go away. Get medical help right away. Call your local emergency services (911 in the [place] Do not drive yourself to the hospital. Summary  Pain, swelling, and bruising are common after the procedure.  Check your incision area every day for signs of infection, such as more redness, swelling, or pain.  Put ice on painful or swollen areas for 20 minutes, 2-3 times a day. This information is not intended to replace advice given to you by your health care provider. Make sure you discuss any questions you have with your health care provider. Document Revised: 12/25/2019 Document Reviewed: 12/25/2019 Elsevier Patient Education  2021 [employer]     GENERAL POST-OPERATIVE PATIENT INSTRUCTIONS   WOUND CARE INSTRUCTIONS:  Keep a dry clean dressing on the wound if there is drainage. The initial bandage may be removed after 24 hours.  Once the wound has quit draining you may leave it open to air.  If clothing rubs against the wound or causes irritation and the wound is not draining you may cover it with a dry dressing during the daytime.  Try to keep the wound dry and avoid ointments on the wound unless directed to do so.  If the wound becomes bright red and painful or starts to  drain infected material that is not clear, please contact your physician immediately.  If the wound is mildly pink and has a thick firm ridge underneath it, this is normal, and is referred to as a healing ridge.  This will resolve over the next 4-6 weeks.  BATHING: You may shower if you have been informed of this by your surgeon. However, Please do not submerge in a tub, hot tub, or pool until incisions are completely sealed or have been told by your surgeon that you may do so.  DIET:  You may eat any foods that you can tolerate.  It is a good idea to eat a high fiber diet and take in plenty of fluids to prevent constipation.  If you do become constipated you may want to take a mild laxative or take ducolax tablets on a daily basis until your bowel habits  are regular.  Constipation can be very uncomfortable, along with straining, after recent surgery.  ACTIVITY:  You are encouraged to cough and deep breath or use your incentive spirometer if you were given one, every 15-30 minutes when awake.  This will help prevent respiratory complications and low grade fevers post-operatively if you had a general anesthetic.  You may want to hug a pillow when coughing and sneezing to add additional support to the surgical area, if you had abdominal or chest surgery, which will decrease pain during these times.  You are encouraged to walk and engage in light activity for the next two weeks.  You should not lift more than 10- 20 pounds, until 09/04/2020 as it could put you at increased risk for complications.  Twenty pounds is roughly equivalent to a plastic bag of groceries. At that time- Listen to your body when lifting, if you have pain when lifting, stop and then try again in a few days. Soreness after doing exercises or activities of daily living is normal as you get back in to your normal routine.  MEDICATIONS:  Try to take narcotic medications and anti-inflammatory medications, such as tylenol, ibuprofen, naprosyn,  etc., with food.  This will minimize stomach upset from the medication.  Should you develop nausea and vomiting from the pain medication, or develop a rash, please discontinue the medication and contact your physician.  You should not drive, make important decisions, or operate machinery when taking narcotic pain medication.  SUNBLOCK Use sun block to incision area over the next year if this area will be exposed to sun. This helps decrease scarring and will allow you avoid a permanent darkened area over your incision.

## 2020-08-04 NOTE — Progress Notes (Signed)
08/04/2020  HPI: Jason [name] is a 65 y.o. male s/p robotic assisted bilateral inguinal hernia repair and open umbilical hernia repair on 07/24/2020.  Patient presents today for follow-up.  Patient called the office last week because of some clear fluid drainage from the umbilical incision.  He denies having any worsening pain or any redness to the incision.  He describes having some discomfort in the lower abdomen on both sides.  Vital signs: BP 133/86   Pulse 66   Temp 98.5 F (36.9 C) (Oral)   Ht 5\' 7"  (1.702 m)   Wt 179 lb 6.4 oz (81.4 kg)   SpO2 99%   BMI 28.10 kg/m    Physical Exam: Constitutional: No acute distress Abdomen: Soft, nondistended, with some discomfort to palpation in the lower abdomen.  Incisions are healing well.  The umbilical incision appears to have had some prior drainage with some scab over the incision but otherwise there is no erythema or purulent drainage or induration.  No evidence of hernia recurrence on either groin or the umbilicus  Assessment/Plan: This is a 65 y.o. male s/p robotic assisted bilateral inguinal hernia repair and open umbilical hernia repair.  -Discussed with patient that the umbilical incision may have opened slightly due to some fluid buildup underneath the skin but does not appear to be infected.  There is no active drainage at this point.  He can continue applying a Band-Aid as needed and may apply Neosporin to the incision as needed as well. -Reassured him the discomfort he is having is probably related to the surgery itself potentially to the sutures used to close the peritoneal defect and this should be improving as everything continues to heal.  There is no evidence of hernia recurrence at this point. -Reminded him of activity restrictions for a total of 6 weeks. -Follow-up as needed.   76, MD [facility]

## 2020-08-05 ENCOUNTER — Ambulatory Visit: Payer: BC Managed Care – PPO | Admitting: Family Medicine

## 2020-09-01 ENCOUNTER — Ambulatory Visit (INDEPENDENT_AMBULATORY_CARE_PROVIDER_SITE_OTHER): Payer: BC Managed Care – PPO | Admitting: Surgery

## 2020-09-01 ENCOUNTER — Encounter: Payer: Self-pay | Admitting: Surgery

## 2020-09-01 VITALS — BP 139/89 | HR 74 | Temp 98.5°F | Ht 67.0 in | Wt 185.0 lb

## 2020-09-01 DIAGNOSIS — K429 Umbilical hernia without obstruction or gangrene: Secondary | ICD-10-CM

## 2020-09-01 DIAGNOSIS — Z09 Encounter for follow-up examination after completed treatment for conditions other than malignant neoplasm: Secondary | ICD-10-CM

## 2020-09-01 DIAGNOSIS — K4020 Bilateral inguinal hernia, without obstruction or gangrene, not specified as recurrent: Secondary | ICD-10-CM

## 2020-09-01 NOTE — Patient Instructions (Addendum)
You can resume your normal activities on 09/04/2020 with no restrictions. If you have any concerns or questions, please feel free to call our office.     Laparoscopic Inguinal Hernia Repair, Adult, Care After The following information offers guidance on how to care for yourself after your procedure. Your health care provider may also give you more specific instructions. If you have problems or questions, contact your health care provider. What can I expect after the procedure? After the procedure, it is common to have:  Pain.  Swelling and bruising around the incision area.  Scrotal swelling, in males.  Some fluid or blood draining from your incisions. Follow these instructions at home: Medicines  Take over-the-counter and prescription medicines only as told by your health care provider.  Ask your health care provider if the medicine prescribed to you: ? Requires you to avoid driving or using machinery. ? Can cause constipation. You may need to take these actions to prevent or treat constipation:  Drink enough fluid to keep your urine pale yellow.  Take over-the-counter or prescription medicines.  Eat foods that are high in fiber, such as beans, whole grains, and fresh fruits and vegetables.  Limit foods that are high in fat and processed sugars, such as fried or sweet foods. Incision care  Follow instructions from your health care provider about how to take care of your incisions. Make sure you: ? Wash your hands with soap and water for at least 20 seconds before and after you change your bandage (dressing). If soap and water are not available, use hand sanitizer. ? Change your dressing as told by your health care provider. ? Leave stitches (sutures), skin glue, or adhesive strips in place. These skin closures may need to stay in place for 2 weeks or longer. If adhesive strip edges start to loosen and curl up, you may trim the loose edges. Do not remove adhesive strips  completely unless your health care provider tells you to do that.  Check your incision area every day for signs of infection. Check for: ? More redness, swelling, or pain. ? More fluid or blood. ? Warmth. ? Pus or a bad smell.  Wear loose, soft clothing while your incisions heal.   Managing pain and swelling If directed, put ice on the painful or swollen areas. To do this:  Put ice in a plastic bag.  Place a towel between your skin and the bag.  Leave the ice on for 20 minutes, 2-3 times a day.  Remove the ice if your skin turns bright red. This is very important. If you cannot feel pain, heat, or cold, you have a greater risk of damage to the area.   Activity  Do not lift anything that is heavier than 10 lb (4.5 kg), or the limit that you are told, until your health care provider says that it is safe.  Ask your health care provider what activities are safe for you. A lot of activity during the first week after surgery can increase pain and swelling. For 1 week after your procedure: ? Avoid activities that take a lot of effort, such as exercise or sports. ? You may walk and climb stairs as needed for daily activity, but avoid long walks or climbing stairs for exercise. General instructions  If you were given a sedative during the procedure, it can affect you for several hours. Do not drive or operate machinery until your health care provider says that it is safe.  Do not  take baths, swim, or use a hot tub until your health care provider approves. Ask your health care provider if you may take showers. You may only be allowed to take sponge baths.  Do not use any products that contain nicotine or tobacco. These products include cigarettes, chewing tobacco, and vaping devices, such as e-cigarettes. If you need help quitting, ask your health care provider.  Keep all follow-up visits. This is important. Contact a health care provider if:  You have any of these signs of  infection: ? More redness, swelling, or pain around your incisions or your groin area. ? More fluid or blood coming from an incision. ? Warmth coming from an incision. ? Pus or a bad smell coming from an incision. ? A fever or chills.  You have more swelling in your scrotum, if you are male.  You have severe pain and medicines do not help.  You have abdominal pain or swelling.  You cannot urinate or have a bowel movement.  You faint or feel dizzy.  You have nausea and vomiting. Get help right away if:  You have redness, warmth, or pain in your leg.  You have chest pain.  You have problems breathing. These symptoms may represent a serious problem that is an emergency. Do not wait to see if the symptoms will go away. Get medical help right away. Call your local emergency services (911 in the [place] Do not drive yourself to the hospital. Summary  Pain, swelling, and bruising are common after the procedure.  Check your incision area every day for signs of infection, such as more redness, swelling, or pain.  Put ice on painful or swollen areas for 20 minutes, 2-3 times a day. This information is not intended to replace advice given to you by your health care provider. Make sure you discuss any questions you have with your health care provider. Document Revised: 12/25/2019 Document Reviewed: 12/25/2019 Elsevier Patient Education  2021 [employer].

## 2020-09-01 NOTE — Progress Notes (Signed)
09/01/2020  HPI: Jason [name] is a 65 y.o. male s/p robotic assisted bilateral inguinal hernia repair and open umbilical hernia repair on 07/24/20.  He presents for follow up.  He called last week to schedule visit because he was still having some discomfort in the low abdomen, and also because he thought one of the incisions was becoming red.  He had a scab over the umbilical incision and he was using Bacitracin and the scab fell off, but this revealed a red area underneath.  Denies any fevers, chills, other abdominal pain.  Eating well, with good bowel function.  Vital signs: BP 139/89   Pulse 74   Temp 98.5 F (36.9 C) (Oral)   Ht 5\' 7"  (1.702 m)   Wt 185 lb (83.9 kg)   SpO2 97%   BMI 28.98 kg/m    Physical Exam: Constitutional:  No acute distress Abdomen:  Soft, non-distended, non-tender to palpation.  Umbilical incision was a very shallow, 2 mm area that's raw where the scab used to be located, without any signs of erythema, induration, or drainage.  The other port incisions are well healed.  No hernia recurrence, and the tissue in both groin areas is soft, without swelling.  Assessment/Plan: This is a 65 y.o. male s/p robotic assisted bilateral inguinal hernia repair and open umbilical hernia repair.  --No evidence of recurrence at this point.  I think the discomfort that he's having in the lower abdomen is related to the sutures used to close the peritoneal flap.  There are no exam findings that would be worrisome otherwise.  This has been improving since surgery, so I think this will continue to improve. --The scab that fell off revealed a very shallow, 2 mm area of raw exposed tissue.  No evidence of infection.  He can continue with bacitracin, and apply gauze dressing or BandAid over it. --Patient is scheduled to return to work on 4/28.  No restrictions from our standpoint.  Will give work note. --Follow up as needed.   5/28, MD [facility]

## 2020-09-02 ENCOUNTER — Ambulatory Visit (INDEPENDENT_AMBULATORY_CARE_PROVIDER_SITE_OTHER): Payer: BC Managed Care – PPO | Admitting: Family Medicine

## 2020-09-02 ENCOUNTER — Encounter: Payer: Self-pay | Admitting: Family Medicine

## 2020-09-02 ENCOUNTER — Other Ambulatory Visit: Payer: Self-pay

## 2020-09-02 VITALS — BP 138/80 | HR 80 | Ht 67.0 in | Wt 182.0 lb

## 2020-09-02 DIAGNOSIS — I10 Essential (primary) hypertension: Secondary | ICD-10-CM | POA: Diagnosis not present

## 2020-09-02 DIAGNOSIS — R3911 Hesitancy of micturition: Secondary | ICD-10-CM | POA: Diagnosis not present

## 2020-09-02 DIAGNOSIS — N401 Enlarged prostate with lower urinary tract symptoms: Secondary | ICD-10-CM | POA: Diagnosis not present

## 2020-09-02 MED ORDER — VALSARTAN-HYDROCHLOROTHIAZIDE 160-25 MG PO TABS: 1.0000 | ORAL_TABLET | Freq: Every day | ORAL | 1 refills | Status: DC

## 2020-09-02 MED ORDER — DOXAZOSIN MESYLATE 1 MG PO TABS: 1.0000 mg | ORAL_TABLET | Freq: Every day | ORAL | 1 refills | Status: DC

## 2020-09-02 NOTE — Progress Notes (Signed)
Date:  09/02/2020   Name:  Jason [name]   DOB:  12/20/1955   MRN:  [id number]   Chief Complaint: Hypertension (Recheck bp reading)  Hypertension This is a chronic problem. The current episode started more than 1 year ago. The problem has been gradually improving since onset. The problem is controlled. Pertinent negatives include no anxiety, blurred vision, chest pain, headaches, malaise/fatigue, neck pain, orthopnea, palpitations, peripheral edema, PND, shortness of breath or sweats. There are no associated agents to hypertension. Past treatments include angiotensin blockers and diuretics. The current treatment provides moderate improvement. There are no compliance problems.  There is no history of angina, kidney disease, CAD/MI, CVA, heart failure, left ventricular hypertrophy, PVD or retinopathy. There is no history of chronic renal disease, a hypertension causing med or renovascular disease.  Male GU Problem The patient's pertinent negatives include no genital injury, genital itching, genital lesions, pelvic pain, penile discharge, priapism, scrotal swelling or testicular pain. This is a chronic problem. Pertinent negatives include no abdominal pain, chest pain, chills, constipation, coughing, diarrhea, dysuria, fever, frequency, headaches, hesitancy, nausea, rash, shortness of breath, sore throat, urgency or urinary retention.    Lab Results  Component Value Date   CREATININE 0.87 07/21/2020   BUN 16 07/21/2020   NA 138 07/21/2020   K 3.3 (L) 07/21/2020   CL 103 07/21/2020   CO2 28 07/21/2020   Lab Results  Component Value Date   CHOL 148 06/23/2020   HDL 46 06/23/2020   LDLCALC 84 06/23/2020   TRIG 94 06/23/2020   CHOLHDL 3.5 05/25/2017   Lab Results  Component Value Date   TSH 1.380 05/25/2017   Lab Results  Component Value Date   HGBA1C 4.6 (L) 05/25/2017   Lab Results  Component Value Date   WBC 3.7 (L) 07/21/2020   HGB 14.1 07/21/2020   HCT 43.1 07/21/2020   MCV  93.1 07/21/2020   PLT 179 07/21/2020   Lab Results  Component Value Date   ALT 24 05/25/2017   AST 22 05/25/2017   ALKPHOS 77 05/25/2017   BILITOT 0.5 05/25/2017     Review of Systems  Constitutional: Negative for chills, fever and malaise/fatigue.  HENT: Negative for drooling, ear discharge, ear pain and sore throat.   Eyes: Negative for blurred vision.  Respiratory: Negative for cough, shortness of breath and wheezing.   Cardiovascular: Negative for chest pain, palpitations, orthopnea, leg swelling and PND.  Gastrointestinal: Negative for abdominal pain, blood in stool, constipation, diarrhea and nausea.  Endocrine: Negative for polydipsia.  Genitourinary: Negative for dysuria, frequency, hematuria, hesitancy, pelvic pain, penile discharge, scrotal swelling, testicular pain and urgency.  Musculoskeletal: Negative for back pain, myalgias and neck pain.  Skin: Negative for rash.  Allergic/Immunologic: Negative for environmental allergies.  Neurological: Negative for dizziness and headaches.  Hematological: Does not bruise/bleed easily.  Psychiatric/Behavioral: Negative for suicidal ideas. The patient is not nervous/anxious.     Patient Active Problem List   Diagnosis Date Noted  . Non-recurrent bilateral inguinal hernia without obstruction or gangrene   . Essential hypertension 05/25/2017  . Umbilical hernia without obstruction and without gangrene 05/25/2017  . Exertional chest pain 05/25/2017  . Cervical radiculopathy 05/25/2017  . FH: diabetes mellitus 05/25/2017    No Known Allergies  Past Surgical History:  Procedure Laterality Date  . SHOULDER ARTHROSCOPY W/ ROTATOR CUFF REPAIR Left 2014  . UMBILICAL HERNIA REPAIR N/A 07/24/2020   Procedure: HERNIA REPAIR UMBILICAL ADULT, open;  Surgeon: Henrene Dodge, MD;  Location: [facility];  Service: General;  Laterality: N/A;  . UPPER GI ENDOSCOPY    . XI ROBOTIC ASSISTED INGUINAL HERNIA REPAIR WITH MESH Left 07/24/2020    Procedure: XI ROBOTIC ASSISTED INGUINAL HERNIA REPAIR WITH MESH;  Surgeon: Henrene Dodge, MD;  Location: [facility] OR[facility];  Service: General;  Laterality: Left;    Social History   Tobacco Use  . Smoking status: Never Smoker  . Smokeless tobacco: Never Used  Vaping Use  . Vaping Use: Never used  Substance Use Topics  . Alcohol use: Yes    Alcohol/week: 1.0 standard drink    Types: 1 Cans of beer per week  . Drug use: No     Medication list has been reviewed and updated.  Current Meds  Medication Sig  . acetaminophen (TYLENOL) 500 MG tablet Take 2 tablets (1,000 mg total) by mouth every 6 (six) hours as needed for mild pain.  . Ascorbic Acid (VITAMIN C) 1000 MG tablet Take 1,000 mg by mouth daily.  . cholecalciferol (VITAMIN D3) 25 MCG (1000 UNIT) tablet Take 1,000 Units by mouth daily.  Marland Kitchen Cod Liver Oil 1000 MG CAPS Take 1,000 mg by mouth daily.  Marland Kitchen doxazosin (CARDURA) 1 MG tablet Take 1 tablet (1 mg total) by mouth daily.  Marland Kitchen ibuprofen (ADVIL) 600 MG tablet Take 1 tablet (600 mg total) by mouth every 8 (eight) hours as needed for moderate pain.  . valsartan-hydrochlorothiazide (DIOVAN-HCT) 160-25 MG tablet Take 1 tablet by mouth daily.    PHQ 2/9 Scores 07/21/2020 05/23/2020 10/24/2018 05/25/2017  PHQ - 2 Score 0 0 0 0  PHQ- 9 Score 0 - 0 -    GAD 7 : Generalized Anxiety Score 07/21/2020  Nervous, Anxious, on Edge 0  Control/stop worrying 0  Worry too much - different things 0  Trouble relaxing 0  Restless 0  Easily annoyed or irritable 0  Afraid - awful might happen 0  Total GAD 7 Score 0  Anxiety Difficulty Not difficult at all    BP Readings from Last 3 Encounters:  09/02/20 138/80  09/01/20 139/89  08/04/20 133/86    Physical Exam Vitals and nursing note reviewed.  HENT:     Head: Normocephalic.     Right Ear: Tympanic membrane, ear canal and external ear normal.     Left Ear: Tympanic membrane, ear canal and external ear normal.     Nose: Nose normal. No congestion  or rhinorrhea.  Eyes:     General: No scleral icterus.       Right eye: No discharge.        Left eye: No discharge.     Conjunctiva/sclera: Conjunctivae normal.     Pupils: Pupils are equal, round, and reactive to light.  Neck:     Thyroid: No thyromegaly.     Vascular: No JVD.     Trachea: No tracheal deviation.  Cardiovascular:     Rate and Rhythm: Normal rate and regular rhythm.     Heart sounds: Normal heart sounds. No murmur heard. No friction rub. No gallop.   Pulmonary:     Effort: No respiratory distress.     Breath sounds: Normal breath sounds. No wheezing, rhonchi or rales.  Chest:     Chest wall: No tenderness.  Abdominal:     General: Bowel sounds are normal.     Palpations: Abdomen is soft. There is no mass.     Tenderness: There is no abdominal tenderness. There is no guarding or rebound.  Musculoskeletal:        General: No tenderness. Normal range of motion.     Cervical back: Normal range of motion and neck supple.  Lymphadenopathy:     Cervical: No cervical adenopathy.  Skin:    General: Skin is warm.     Findings: No bruising, erythema or rash.  Neurological:     Mental Status: He is alert and oriented to person, place, and time.     Cranial Nerves: No cranial nerve deficit.     Motor: No weakness.     Deep Tendon Reflexes: Reflexes are normal and symmetric.     Wt Readings from Last 3 Encounters:  09/02/20 182 lb (82.6 kg)  09/01/20 185 lb (83.9 kg)  08/04/20 179 lb 6.4 oz (81.4 kg)    BP 138/80   Pulse 80   Ht 5\' 7"  (1.702 m)   Wt 182 lb (82.6 kg)   BMI 28.51 kg/m   Assessment and Plan:  1. Essential hypertension Chronic.  Controlled.  Stable.  Blood pressure today is 138/80 and is acceptable.  We will continue valsartan hydrochlorothiazide 1 6025 1 a day and Cardura 1 mg 1 tablet a day.  We will recheck in 6 months.  Review of BMP from this past March is acceptable for electrolytes and GFR. - valsartan-hydrochlorothiazide (DIOVAN-HCT)  160-25 MG tablet; Take 1 tablet by mouth daily.  Dispense: 90 tablet; Refill: 1 - doxazosin (CARDURA) 1 MG tablet; Take 1 tablet (1 mg total) by mouth daily.  Dispense: 90 tablet; Refill: 1  2. Benign prostatic hyperplasia with urinary hesitancy Chronic.  Controlled.  Stable.  Patient does not have hesitancy nor issues with frequency and urgency since initiation of Cardura 1 mg daily.  We will continue with this and will recheck in 6 months - doxazosin (CARDURA) 1 MG tablet; Take 1 tablet (1 mg total) by mouth daily.  Dispense: 90 tablet; Refill: 1

## 2020-09-09 DIAGNOSIS — H2512 Age-related nuclear cataract, left eye: Secondary | ICD-10-CM | POA: Diagnosis not present

## 2020-09-09 DIAGNOSIS — H1589 Other disorders of sclera: Secondary | ICD-10-CM | POA: Diagnosis not present

## 2020-09-09 DIAGNOSIS — I10 Essential (primary) hypertension: Secondary | ICD-10-CM | POA: Diagnosis not present

## 2020-09-09 DIAGNOSIS — H401123 Primary open-angle glaucoma, left eye, severe stage: Secondary | ICD-10-CM | POA: Diagnosis not present

## 2020-10-23 DIAGNOSIS — H59032 Cystoid macular edema following cataract surgery, left eye: Secondary | ICD-10-CM | POA: Diagnosis not present

## 2020-10-30 DIAGNOSIS — H5021 Vertical strabismus, right eye: Secondary | ICD-10-CM | POA: Diagnosis not present

## 2020-10-30 DIAGNOSIS — H401133 Primary open-angle glaucoma, bilateral, severe stage: Secondary | ICD-10-CM | POA: Diagnosis not present

## 2020-10-30 DIAGNOSIS — H5333 Simultaneous visual perception without fusion: Secondary | ICD-10-CM | POA: Diagnosis not present

## 2020-10-30 DIAGNOSIS — H519 Unspecified disorder of binocular movement: Secondary | ICD-10-CM | POA: Diagnosis not present

## 2020-11-06 ENCOUNTER — Other Ambulatory Visit: Payer: Self-pay | Admitting: Surgery

## 2020-11-13 ENCOUNTER — Other Ambulatory Visit: Payer: Self-pay | Admitting: Family Medicine

## 2020-11-13 DIAGNOSIS — I10 Essential (primary) hypertension: Secondary | ICD-10-CM

## 2020-11-13 DIAGNOSIS — H59032 Cystoid macular edema following cataract surgery, left eye: Secondary | ICD-10-CM | POA: Diagnosis not present

## 2020-11-13 NOTE — Telephone Encounter (Signed)
   Notes to clinic:  The original prescription was discontinued on 07/21/2020 by Mariel Sleet, CMA for the following reason: Patient Preference. Renewing this prescription may not be appropriate   Requested Prescriptions  Pending Prescriptions Disp Refills   amLODipine (NORVASC) 5 MG tablet [Pharmacy Med Name: AMLODIPINE BESYLATE 5 MG TAB] 90 tablet 1    Sig: Take 1 tablet (5 mg total) by mouth daily.      Cardiovascular:  Calcium Channel Blockers Passed - 11/13/2020 12:39 PM      Passed - Last BP in normal range    BP Readings from Last 1 Encounters:  09/02/20 138/80          Passed - Valid encounter within last 6 months    Recent Outpatient Visits           2 months ago Essential hypertension   [facility] Duanne Limerick, MD   3 months ago Preop examination   [facility] Duanne Limerick, MD   4 months ago Essential hypertension   [facility] Duanne Limerick, MD   5 months ago Chest wall pain   [facility] Duanne Limerick, MD   1 year ago Essential hypertension   [facility] Duanne Limerick, MD       Future Appointments             In 3 months Duanne Limerick, MD [facility], [facility]

## 2020-11-14 DIAGNOSIS — H401133 Primary open-angle glaucoma, bilateral, severe stage: Secondary | ICD-10-CM | POA: Diagnosis not present

## 2020-11-14 DIAGNOSIS — H59032 Cystoid macular edema following cataract surgery, left eye: Secondary | ICD-10-CM | POA: Diagnosis not present

## 2020-11-27 DIAGNOSIS — H59032 Cystoid macular edema following cataract surgery, left eye: Secondary | ICD-10-CM | POA: Diagnosis not present

## 2020-12-04 DIAGNOSIS — H401133 Primary open-angle glaucoma, bilateral, severe stage: Secondary | ICD-10-CM | POA: Diagnosis not present

## 2020-12-04 DIAGNOSIS — H59032 Cystoid macular edema following cataract surgery, left eye: Secondary | ICD-10-CM | POA: Diagnosis not present

## 2020-12-05 ENCOUNTER — Other Ambulatory Visit: Payer: Self-pay | Admitting: Family Medicine

## 2020-12-05 DIAGNOSIS — I10 Essential (primary) hypertension: Secondary | ICD-10-CM

## 2020-12-05 DIAGNOSIS — R3911 Hesitancy of micturition: Secondary | ICD-10-CM

## 2020-12-05 DIAGNOSIS — N401 Enlarged prostate with lower urinary tract symptoms: Secondary | ICD-10-CM

## 2021-02-17 ENCOUNTER — Ambulatory Visit: Payer: BC Managed Care – PPO | Admitting: Family Medicine

## 2021-02-23 ENCOUNTER — Encounter: Payer: Self-pay | Admitting: General Surgery

## 2021-05-22 DIAGNOSIS — H53453 Other localized visual field defect, bilateral: Secondary | ICD-10-CM | POA: Diagnosis not present

## 2021-05-22 DIAGNOSIS — H59032 Cystoid macular edema following cataract surgery, left eye: Secondary | ICD-10-CM | POA: Diagnosis not present

## 2021-07-09 DIAGNOSIS — H524 Presbyopia: Secondary | ICD-10-CM | POA: Diagnosis not present

## 2021-07-16 ENCOUNTER — Ambulatory Visit (INDEPENDENT_AMBULATORY_CARE_PROVIDER_SITE_OTHER): Payer: Self-pay | Admitting: Family Medicine

## 2021-07-16 ENCOUNTER — Other Ambulatory Visit: Payer: Self-pay

## 2021-07-16 ENCOUNTER — Encounter: Payer: Self-pay | Admitting: Family Medicine

## 2021-07-16 VITALS — BP 122/78 | HR 61 | Ht 67.0 in | Wt 181.0 lb

## 2021-07-16 DIAGNOSIS — N401 Enlarged prostate with lower urinary tract symptoms: Secondary | ICD-10-CM

## 2021-07-16 DIAGNOSIS — J301 Allergic rhinitis due to pollen: Secondary | ICD-10-CM

## 2021-07-16 DIAGNOSIS — R3911 Hesitancy of micturition: Secondary | ICD-10-CM | POA: Diagnosis not present

## 2021-07-16 DIAGNOSIS — I10 Essential (primary) hypertension: Secondary | ICD-10-CM

## 2021-07-16 DIAGNOSIS — Z6828 Body mass index (BMI) 28.0-28.9, adult: Secondary | ICD-10-CM | POA: Diagnosis not present

## 2021-07-16 DIAGNOSIS — Z1211 Encounter for screening for malignant neoplasm of colon: Secondary | ICD-10-CM

## 2021-07-16 MED ORDER — DOXAZOSIN MESYLATE 1 MG PO TABS: 1.0000 mg | ORAL_TABLET | Freq: Every day | ORAL | 1 refills | Status: DC

## 2021-07-16 MED ORDER — VALSARTAN-HYDROCHLOROTHIAZIDE 160-25 MG PO TABS: 1.0000 | ORAL_TABLET | Freq: Every day | ORAL | 1 refills | Status: DC

## 2021-07-16 MED ORDER — PEG 3350-KCL-NA BICARB-NACL 420 G PO SOLR: 4000.0000 mL | Freq: Once | ORAL | 0 refills | Status: AC

## 2021-07-16 NOTE — Progress Notes (Signed)
Date:  07/16/2021   Name:  Jason [name]   DOB:  [birth year]   MRN:  [id number]   Chief Complaint: Hypertension  Hypertension This is a chronic problem. The current episode started more than 1 year ago. The problem has been gradually improving since onset. The problem is controlled. Pertinent negatives include no anxiety, blurred vision, chest pain, headaches, malaise/fatigue, neck pain, orthopnea, palpitations, peripheral edema, PND, shortness of breath or sweats. There are no associated agents to hypertension. There are no known risk factors for coronary artery disease. Past treatments include ACE inhibitors.   Lab Results  Component Value Date   NA 138 07/21/2020   K 3.3 (L) 07/21/2020   CO2 28 07/21/2020   GLUCOSE 135 (H) 07/21/2020   BUN 16 07/21/2020   CREATININE 0.87 07/21/2020   CALCIUM 8.9 07/21/2020   GFRNONAA >60 07/21/2020   Lab Results  Component Value Date   CHOL 148 06/23/2020   HDL 46 06/23/2020   LDLCALC 84 06/23/2020   TRIG 94 06/23/2020   CHOLHDL 3.5 05/25/2017   Lab Results  Component Value Date   TSH 1.380 05/25/2017   Lab Results  Component Value Date   HGBA1C 4.6 (L) 05/25/2017   Lab Results  Component Value Date   WBC 3.7 (L) 07/21/2020   HGB 14.1 07/21/2020   HCT 43.1 07/21/2020   MCV 93.1 07/21/2020   PLT 179 07/21/2020   Lab Results  Component Value Date   ALT 24 05/25/2017   AST 22 05/25/2017   ALKPHOS 77 05/25/2017   BILITOT 0.5 05/25/2017   No results found for: 25OHVITD2, 25OHVITD3, VD25OH   Review of Systems  Constitutional:  Negative for chills, fever and malaise/fatigue.  HENT:  Negative for drooling, ear discharge, ear pain and sore throat.   Eyes:  Negative for blurred vision.  Respiratory:  Negative for cough, shortness of breath and wheezing.   Cardiovascular:  Negative for chest pain, palpitations, orthopnea, leg swelling and PND.  Gastrointestinal:  Negative for abdominal pain, blood in stool, constipation, diarrhea  and nausea.  Endocrine: Negative for polydipsia.  Genitourinary:  Negative for dysuria, frequency, hematuria and urgency.  Musculoskeletal:  Negative for back pain, myalgias and neck pain.  Skin:  Negative for rash.  Allergic/Immunologic: Negative for environmental allergies.  Neurological:  Negative for dizziness and headaches.  Hematological:  Does not bruise/bleed easily.  Psychiatric/Behavioral:  Negative for suicidal ideas. The patient is not nervous/anxious.    Patient Active Problem List   Diagnosis Date Noted   Non-recurrent bilateral inguinal hernia without obstruction or gangrene    Essential hypertension 05/25/2017   Umbilical hernia without obstruction and without gangrene 05/25/2017   Exertional chest pain 05/25/2017   Cervical radiculopathy 05/25/2017   FH: diabetes mellitus 05/25/2017    No Known Allergies  Past Surgical History:  Procedure Laterality Date   SHOULDER ARTHROSCOPY W/ ROTATOR CUFF REPAIR Left 2014   UMBILICAL HERNIA REPAIR N/A 07/24/2020   Procedure: HERNIA REPAIR UMBILICAL ADULT, open;  Surgeon: Henrene Dodge, MD;  Location: [facility] ORS;  Service: General;  Laterality: N/A;   UPPER GI ENDOSCOPY     XI ROBOTIC ASSISTED INGUINAL HERNIA REPAIR WITH MESH Left 07/24/2020   Procedure: XI ROBOTIC ASSISTED INGUINAL HERNIA REPAIR WITH MESH;  Surgeon: Henrene Dodge, MD;  Location: [facility] ORS;  Service: General;  Laterality: Left;    Social History   Tobacco Use   Smoking status: Never   Smokeless tobacco: Never  Vaping Use   Vaping Use:  Never used  Substance Use Topics   Alcohol use: Yes    Alcohol/week: 1.0 standard drink    Types: 1 Cans of beer per week   Drug use: No     Medication list has been reviewed and updated.  Current Meds  Medication Sig   acetaminophen (TYLENOL) 500 MG tablet Take 2 tablets (1,000 mg total) by mouth every 6 (six) hours as needed for mild pain.   Ascorbic Acid (VITAMIN C) 1000 MG tablet Take 1,000 mg by mouth daily.    brimonidine (ALPHAGAN) 0.2 % ophthalmic solution Apply to eye.   cholecalciferol (VITAMIN D3) 25 MCG (1000 UNIT) tablet Take 1,000 Units by mouth daily.   Cod Liver Oil 1000 MG CAPS Take 1,000 mg by mouth daily.   dorzolamide-timolol (COSOPT) 22.3-6.8 MG/ML ophthalmic solution Apply to eye.   doxazosin (CARDURA) 1 MG tablet Take 1 tablet (1 mg total) by mouth daily.   ibuprofen (ADVIL) 600 MG tablet Take 1 tablet (600 mg total) by mouth every 8 (eight) hours as needed for moderate pain.   latanoprost (XALATAN) 0.005 % ophthalmic solution 1 drop at bedtime.   valsartan-hydrochlorothiazide (DIOVAN-HCT) 160-25 MG tablet Take 1 tablet by mouth daily.    PHQ 2/9 Scores 07/16/2021 07/21/2020 05/23/2020 10/24/2018  PHQ - 2 Score 0 0 0 0  PHQ- 9 Score 0 0 - 0    GAD 7 : Generalized Anxiety Score 07/16/2021 07/21/2020  Nervous, Anxious, on Edge 0 0  Control/stop worrying 0 0  Worry too much - different things 0 0  Trouble relaxing 0 0  Restless 0 0  Easily annoyed or irritable 0 0  Afraid - awful might happen 0 0  Total GAD 7 Score 0 0  Anxiety Difficulty Not difficult at all Not difficult at all    BP Readings from Last 3 Encounters:  07/16/21 122/78  09/02/20 138/80  09/01/20 139/89    Physical Exam Vitals and nursing note reviewed.  HENT:     Head: Normocephalic.     Right Ear: Tympanic membrane, ear canal and external ear normal. There is no impacted cerumen.     Left Ear: Tympanic membrane, ear canal and external ear normal. There is no impacted cerumen.     Nose: Nose normal. No congestion or rhinorrhea.  Eyes:     General: No scleral icterus.       Right eye: No discharge.        Left eye: No discharge.     Conjunctiva/sclera: Conjunctivae normal.     Pupils: Pupils are equal, round, and reactive to light.  Neck:     Thyroid: No thyromegaly.     Vascular: No JVD.     Trachea: No tracheal deviation.  Cardiovascular:     Rate and Rhythm: Normal rate and regular rhythm.      Heart sounds: Normal heart sounds, S1 normal and S2 normal. No murmur heard. No systolic murmur is present.  No diastolic murmur is present.    No friction rub. No gallop. No S3 or S4 sounds.  Pulmonary:     Effort: No respiratory distress.     Breath sounds: Normal breath sounds. No wheezing, rhonchi or rales.  Abdominal:     General: Bowel sounds are normal.     Palpations: Abdomen is soft. There is no mass.     Tenderness: There is no abdominal tenderness. There is no guarding or rebound.  Genitourinary:    Prostate: Normal. Not enlarged, not tender and no  nodules present.     Rectum: Normal. Guaiac result negative. No mass.  Musculoskeletal:        General: No tenderness. Normal range of motion.     Cervical back: Normal range of motion and neck supple.  Lymphadenopathy:     Cervical: No cervical adenopathy.  Skin:    General: Skin is warm.     Findings: No rash.  Neurological:     Mental Status: He is alert and oriented to person, place, and time.     Cranial Nerves: No cranial nerve deficit.     Deep Tendon Reflexes: Reflexes are normal and symmetric.    Wt Readings from Last 3 Encounters:  07/16/21 181 lb (82.1 kg)  09/02/20 182 lb (82.6 kg)  09/01/20 185 lb (83.9 kg)    BP 122/78    Pulse 61    Ht 5\' 7"  (1.702 m)    Wt 181 lb (82.1 kg)    SpO2 98%    BMI 28.35 kg/m   Assessment and Plan:  1. Essential hypertension Chronic.  Controlled.  Stable.  Continue valsartan hydrochlorothiazide 1 60-25 1 a day and Cardura 1 mg 1 tablet daily.  We will check basic metabolic panel and lipid panel. - Basic metabolic panel - Lipid Panel With LDL/HDL Ratio - valsartan-hydrochlorothiazide (DIOVAN-HCT) 160-25 MG tablet; Take 1 tablet by mouth daily.  Dispense: 90 tablet; Refill: 1 - doxazosin (CARDURA) 1 MG tablet; Take 1 tablet (1 mg total) by mouth daily.  Dispense: 90 tablet; Refill: 1  2. Benign prostatic hyperplasia with urinary hesitancy Chronic.  Relatively controlled.   Stable.  Patient has may be some difficulty holding his urine for necessary time.  This may reflect that there is incomplete emptying of the bladder and if this is to continue patient is to return and we will refer to urology.  In the meantime he will continue his Cardura 1 mg daily.  DRE today notes that prostate is normal size shape consistency and with no nodularity and PSA was obtained.  Incidentally Hemoccult was negative - doxazosin (CARDURA) 1 MG tablet; Take 1 tablet (1 mg total) by mouth daily.  Dispense: 90 tablet; Refill: 1 - PSA  3. Seasonal allergic rhinitis due to pollen Episodic.  Currently active.  Patient has some swelling in the nasal passages.  This is consistent with allergic rhinitis and patient is suggested to get Mucinex DM with the associated postnasal drainage cough.  4. BMI 28.0-28.9,adult Noted mild elevation in weight.  Patient has been instructed to diet and has been given low-cholesterol dietary guidelines.  Will check lipid panel for current status. - Lipid Panel With LDL/HDL Ratio  5. Colon cancer screening DRE is normal with negative guaiac.  Patient has been referred to GI for colonoscopy. - Ambulatory referral to Gastroenterology

## 2021-07-16 NOTE — Progress Notes (Signed)
Gastroenterology Pre-Procedure Review ? ?Request Date: 08/10/2021 ?Requesting Physician: Dr. Servando Snare ? ?PATIENT REVIEW QUESTIONS: The patient responded to the following health history questions as indicated:   ? ?1. Are you having any GI issues? no ?2. Do you have a personal history of Polyps? no ?3. Do you have a family history of Colon Cancer or Polyps? no ?4. Diabetes Mellitus? no ?5. Joint replacements in the past 12 months?no ?6. Major health problems in the past 3 months?no ?7. Any artificial heart valves, MVP, or defibrillator?no ?   ?MEDICATIONS & ALLERGIES:    ?Patient reports the following regarding taking any anticoagulation/antiplatelet therapy:   ?Plavix, Coumadin, Eliquis, Xarelto, Lovenox, Pradaxa, Brilinta, or Effient? no ?Aspirin? no ? ?Patient confirms/reports the following medications:  ?Current Outpatient Medications  ?Medication Sig Dispense Refill  ? acetaminophen (TYLENOL) 500 MG tablet Take 2 tablets (1,000 mg total) by mouth every 6 (six) hours as needed for mild pain.    ? Ascorbic Acid (VITAMIN C) 1000 MG tablet Take 1,000 mg by mouth daily.    ? brimonidine (ALPHAGAN) 0.2 % ophthalmic solution Apply to eye.    ? cholecalciferol (VITAMIN D3) 25 MCG (1000 UNIT) tablet Take 1,000 Units by mouth daily.    ? Cod Liver Oil 1000 MG CAPS Take 1,000 mg by mouth daily.    ? dorzolamide-timolol (COSOPT) 22.3-6.8 MG/ML ophthalmic solution Apply to eye.    ? doxazosin (CARDURA) 1 MG tablet Take 1 tablet (1 mg total) by mouth daily. 90 tablet 1  ? ibuprofen (ADVIL) 600 MG tablet Take 1 tablet (600 mg total) by mouth every 8 (eight) hours as needed for moderate pain. 60 tablet 1  ? latanoprost (XALATAN) 0.005 % ophthalmic solution 1 drop at bedtime.    ? valsartan-hydrochlorothiazide (DIOVAN-HCT) 160-25 MG tablet Take 1 tablet by mouth daily. 90 tablet 1  ? ?No current facility-administered medications for this visit.  ? ? ?Patient confirms/reports the following allergies:  ?No Known Allergies ? ?No orders  of the defined types were placed in this encounter. ? ? ?AUTHORIZATION INFORMATION ?Primary Insurance: ?1D#: ?Group #: ? ?Secondary Insurance: ?1D#: ?Group #: ? ?SCHEDULE INFORMATION: ?Date: 08/10/2021 ?Time: ?Location: [facility] ? ?

## 2021-07-16 NOTE — Patient Instructions (Signed)

## 2021-07-17 LAB — BASIC METABOLIC PANEL
BUN/Creatinine Ratio: 14 (ref 10–24)
BUN: 14 mg/dL (ref 8–27)
CO2: 27 mmol/L (ref 20–29)
Calcium: 9.8 mg/dL (ref 8.6–10.2)
Chloride: 100 mmol/L (ref 96–106)
Creatinine, Ser: 1 mg/dL (ref 0.76–1.27)
Glucose: 104 mg/dL — ABNORMAL HIGH (ref 70–99)
Potassium: 4 mmol/L (ref 3.5–5.2)
Sodium: 140 mmol/L (ref 134–144)
eGFR: 84 mL/min/{1.73_m2} (ref >59)

## 2021-07-17 LAB — LIPID PANEL WITH LDL/HDL RATIO
Cholesterol, Total: 156 mg/dL (ref 100–199)
HDL: 49 mg/dL (ref >39)
LDL Chol Calc (NIH): 97 mg/dL (ref 0–99)
LDL/HDL Ratio: 2 ratio (ref 0.0–3.6)
Triglycerides: 49 mg/dL (ref 0–149)
VLDL Cholesterol Cal: 10 mg/dL (ref 5–40)

## 2021-07-17 LAB — PSA: Prostate Specific Ag, Serum: 1 ng/mL (ref 0.0–4.0)

## 2021-08-04 ENCOUNTER — Encounter: Payer: Self-pay | Admitting: Family Medicine

## 2021-08-05 ENCOUNTER — Encounter: Payer: Self-pay | Admitting: Gastroenterology

## 2021-08-07 DIAGNOSIS — H59032 Cystoid macular edema following cataract surgery, left eye: Secondary | ICD-10-CM | POA: Diagnosis not present

## 2021-08-10 ENCOUNTER — Ambulatory Visit
Admission: RE | Admit: 2021-08-10 | Discharge: 2021-08-10 | Disposition: A | Discharge status: Home / Self Care | Payer: Medicare Other | Attending: Gastroenterology | Admitting: Gastroenterology

## 2021-08-10 ENCOUNTER — Encounter
Admission: RE | Disposition: A | Discharge status: Home / Self Care | Payer: Self-pay | Source: Home / Self Care | Attending: Gastroenterology

## 2021-08-10 ENCOUNTER — Other Ambulatory Visit: Payer: Self-pay

## 2021-08-10 ENCOUNTER — Ambulatory Visit: Payer: Medicare Other | Admitting: Anesthesiology

## 2021-08-10 DIAGNOSIS — J45909 Unspecified asthma, uncomplicated: Secondary | ICD-10-CM | POA: Insufficient documentation

## 2021-08-10 DIAGNOSIS — Z1211 Encounter for screening for malignant neoplasm of colon: Secondary | ICD-10-CM | POA: Insufficient documentation

## 2021-08-10 DIAGNOSIS — K640 First degree hemorrhoids: Secondary | ICD-10-CM | POA: Insufficient documentation

## 2021-08-10 DIAGNOSIS — I10 Essential (primary) hypertension: Secondary | ICD-10-CM | POA: Insufficient documentation

## 2021-08-10 HISTORY — PX: COLONOSCOPY WITH PROPOFOL: SHX5780

## 2021-08-10 HISTORY — DX: Presence of dental prosthetic device (complete) (partial): Z97.2

## 2021-08-10 SURGERY — COLONOSCOPY WITH PROPOFOL
Anesthesia: General | Site: Rectum

## 2021-08-10 MED ORDER — SIMETHICONE 40 MG/0.6ML PO SUSP
ORAL | Status: DC | PRN
  Administered 2021-08-10: 60 mL

## 2021-08-10 MED ORDER — SODIUM CHLORIDE 0.9 % IV SOLN: INTRAVENOUS | Status: DC

## 2021-08-10 MED ORDER — ACETAMINOPHEN 500 MG PO TABS: 1000.0000 mg | ORAL_TABLET | Freq: Once | ORAL | Status: DC | PRN

## 2021-08-10 MED ORDER — LIDOCAINE HCL (CARDIAC) PF 100 MG/5ML IV SOSY
PREFILLED_SYRINGE | INTRAVENOUS | Status: DC | PRN
Start: 2021-08-10 — End: 2021-08-10
  Administered 2021-08-10: 30 mg via INTRAVENOUS

## 2021-08-10 MED ORDER — PROPOFOL 10 MG/ML IV BOLUS
INTRAVENOUS | Status: DC | PRN
  Administered 2021-08-10: 30 mg via INTRAVENOUS
  Administered 2021-08-10: 150 mg via INTRAVENOUS
  Administered 2021-08-10 (×2): 30 mg via INTRAVENOUS
  Administered 2021-08-10 (×2): 20 mg via INTRAVENOUS
  Administered 2021-08-10: 30 mg via INTRAVENOUS

## 2021-08-10 MED ORDER — ONDANSETRON HCL 4 MG/2ML IJ SOLN: 4.0000 mg | Freq: Once | INTRAMUSCULAR | Status: DC | PRN

## 2021-08-10 MED ORDER — LACTATED RINGERS IV SOLN: INTRAVENOUS | Status: DC

## 2021-08-10 MED ORDER — ACETAMINOPHEN 160 MG/5ML PO SOLN: 975.0000 mg | Freq: Once | ORAL | Status: DC | PRN

## 2021-08-10 MED ORDER — STERILE WATER FOR IRRIGATION IR SOLN
Status: DC | PRN
Start: 2021-08-10 — End: 2021-08-10
  Administered 2021-08-10: 1

## 2021-08-10 SURGICAL SUPPLY — 6 items
GOWN CVR UNV OPN BCK APRN NK (MISCELLANEOUS) ×2 IMPLANT
GOWN ISOL THUMB LOOP REG UNIV (MISCELLANEOUS) ×4
KIT PRC NS LF DISP ENDO (KITS) ×1 IMPLANT
KIT PROCEDURE OLYMPUS (KITS) ×2
MANIFOLD NEPTUNE II (INSTRUMENTS) ×2 IMPLANT
WATER STERILE IRR 250ML POUR (IV SOLUTION) ×2 IMPLANT

## 2021-08-10 NOTE — Op Note (Signed)
[facility] ?Gastroenterology ?Patient Name: Jason GambleJason Heino ?Procedure Date: 08/10/2021 8:55 AM ?MRN: [id number] ?Account #: [id number] ?Date of Birth: [birth year] ?Admit Type: Outpatient ?Age: 66 ?[facility] ?Gender: Male ?Note Status: Finalized ?Instrument Name: [id number] ?Procedure:             Colonoscopy ?Indications:           Screening for colorectal malignant neoplasm ?Providers:             Midge Miniumarren Jeweline Reif MD, MD ?Referring MD:          Duanne Limerickeanna C. Jones, MD (Referring MD) ?Medicines:             Propofol per Anesthesia ?Complications:         No immediate complications. ?Procedure:             Pre-Anesthesia Assessment: ?                       - Prior to the procedure, a History and Physical was  ?                       performed, and patient medications and allergies were  ?                       reviewed. The patient's tolerance of previous  ?                       anesthesia was also reviewed. The risks and benefits  ?                       of the procedure and the sedation options and risks  ?                       were discussed with the patient. All questions were  ?                       answered, and informed consent was obtained. Prior  ?                       Anticoagulants: The patient has taken no previous  ?                       anticoagulant or antiplatelet agents. ASA Grade  ?                       Assessment: II - A patient with mild systemic disease.  ?                       After reviewing the risks and benefits, the patient  ?                       was deemed in satisfactory condition to undergo the  ?                       procedure. ?                       After obtaining informed consent, the colonoscope was  ?  passed under direct vision. Throughout the procedure,  ?                       the patient's blood pressure, pulse, and oxygen  ?                       saturations were monitored continuously. The  ?                       Colonoscope was  introduced through the anus and  ?                       advanced to the the cecum, identified by appendiceal  ?                       orifice and ileocecal valve. The colonoscopy was  ?                       performed without difficulty. The patient tolerated  ?                       the procedure well. The quality of the bowel  ?                       preparation was excellent. ?Findings: ?     The perianal and digital rectal examinations were normal. ?     Non-bleeding internal hemorrhoids were found during retroflexion. The  ?     hemorrhoids were Grade I (internal hemorrhoids that do not prolapse). ?Impression:            - Non-bleeding internal hemorrhoids. ?                       - No specimens collected. ?Recommendation:        - Discharge patient to home. ?                       - Resume previous diet. ?                       - Continue present medications. ?Procedure Code(s):     --- Professional --- ?                       [phone], Colonoscopy, flexible; diagnostic, including  ?                       collection of specimen(s) by brushing or washing, when  ?                       performed (separate procedure) ?Diagnosis Code(s):     --- Professional --- ?                       Z12.11, Encounter for screening for malignant neoplasm  ?                       of colon ?CPT copyright 2019 [employer]. All rights reserved. ?The codes documented in this report are preliminary and upon coder review may  ?be revised to meet current compliance requirements. ?Midge Minium MD, MD ?08/10/2021  9:17:11 AM ?This report has been signed electronically. ?Number of Addenda: 0 ?Note Initiated On: 08/10/2021 8:55 AM ?Scope Withdrawal Time: 0 hours 7 minutes 4 seconds  ?Total Procedure Duration: 0 hours 11 minutes 44 seconds  ?Estimated Blood Loss:  Estimated blood loss: none. ?     [facility] ?

## 2021-08-10 NOTE — H&P (Signed)
? ?Midge Minium, MD [facility] ?[phone] ?[place], [place] [address] ?Phone: [phone] ?Fax : [phone] ? ?Primary Care Physician:  Duanne Limerick, MD ?Primary Gastroenterologist:  Dr. Servando Snare ? ?Pre-Procedure History & Physical: ?HPI:  Jason [name] is a 66 y.o. male is here for a screening colonoscopy.  ? ?Past Medical History:  ?Diagnosis Date  ? Asthma   ? as a child   ? Hypertension   ? Wears dentures   ? partial upper and lower  ? ? ?Past Surgical History:  ?Procedure Laterality Date  ? SHOULDER ARTHROSCOPY W/ ROTATOR CUFF REPAIR Left 2014  ? TRABECULECTOMY Left   ? UMBILICAL HERNIA REPAIR N/A 07/24/2020  ? Procedure: HERNIA REPAIR UMBILICAL ADULT, open;  Surgeon: Henrene Dodge, MD;  Location: [facility] ORS;  Service: General;  Laterality: N/A;  ? UPPER GI ENDOSCOPY    ? XI ROBOTIC ASSISTED INGUINAL HERNIA REPAIR WITH MESH Left 07/24/2020  ? Procedure: XI ROBOTIC ASSISTED INGUINAL HERNIA REPAIR WITH MESH;  Surgeon: Henrene Dodge, MD;  Location: [facility];  Service: General;  Laterality: Left;  ? ? ?Prior to Admission medications   ?Medication Sig Start Date End Date Taking? Authorizing Provider  ?acetaminophen (TYLENOL) 500 MG tablet Take 2 tablets (1,000 mg total) by mouth every 6 (six) hours as needed for mild pain. 07/24/20  Yes Piscoya, Elita Quick, MD  ?amLODipine (NORVASC) 5 MG tablet Take 5 mg by mouth daily.   Yes [provider]  ?Ascorbic Acid (VITAMIN C) 1000 MG tablet Take 1,000 mg by mouth daily.   Yes [provider]  ?cholecalciferol (VITAMIN D3) 25 MCG (1000 UNIT) tablet Take 1,000 Units by mouth daily.   Yes [provider]  ?Cod Liver Oil 1000 MG CAPS Take 1,000 mg by mouth daily.   Yes [provider]  ?dextromethorphan-guaiFENesin (MUCINEX DM) 30-600 MG 12hr tablet Take 1 tablet by mouth 2 (two) times daily as needed for cough.   Yes [provider]  ?doxazosin (CARDURA) 1 MG tablet Take 1 tablet (1 mg total) by mouth daily. 07/16/21  Yes Duanne Limerick, MD  ?ibuprofen (ADVIL) 600 MG tablet Take 1 tablet (600 mg total) by mouth every 8 (eight) hours as needed for moderate pain. 07/24/20  Yes Piscoya, Elita Quick, MD  ?latanoprost (XALATAN) 0.005 % ophthalmic solution 1 drop at bedtime. 05/25/21  Yes [provider]  ?Misc Natural Products (HORNY GOAT WEED PO) Take by mouth.   Yes [provider]  ?Multiple Vitamins-Minerals (MULTIVITAMIN MEN PO) Take by mouth. Mega Man   Yes [provider]  ?valsartan-hydrochlorothiazide (DIOVAN-HCT) 160-25 MG tablet Take 1 tablet by mouth daily. 07/16/21  Yes Duanne Limerick, MD  ? ? ?Allergies as of 07/16/2021  ? (No Known Allergies)  ? ? ?Family History  ?Problem Relation Age of Onset  ? Diabetes Mother   ? Prostatitis Father   ? Prostatitis Paternal Aunt   ? ? ?Social History  ? ?Socioeconomic History  ? Marital status: Widowed  ?  Spouse name: Not on file  ? Number of children: Not on file  ? Years of education: Not on file  ? Highest education level: Not on file  ?Occupational History  ? Occupation: employed  ?Tobacco Use  ? Smoking status: Never  ? Smokeless tobacco: Never  ?Vaping Use  ? Vaping Use: Never used  ?Substance and Sexual Activity  ? Alcohol use: Yes  ?  Alcohol/week: 10.0 standard drinks  ?  Types: 10 Cans of beer per week  ?  Drug use: Yes  ?  Types: Marijuana  ?  Comment: Occasionally  ? Sexual activity: Not on file  ?Other Topics Concern  ? Not on file  ?Social History Narrative  ? Not on file  ? ?Social Determinants of Health  ? ?Financial Resource Strain: Not on file  ?Food Insecurity: Not on file  ?Transportation Needs: Not on file  ?Physical Activity: Not on file  ?Stress: Not on file  ?Social Connections: Not on file  ?Intimate Partner Violence: Not on file  ? ? ?Review of Systems: ?See HPI, otherwise negative ROS ? ?Physical Exam: ?BP (!) 146/94   Pulse (!) 57   Temp 97.8 ?F (36.6 ?C)   Ht 5\' 7"  (1.702 m)   Wt 80 kg   SpO2 97%   BMI 27.62 kg/m?  ?General:   Alert,  pleasant and  cooperative in NAD ?Head:  Normocephalic and atraumatic. ?Neck:  Supple; no masses or thyromegaly. ?Lungs:  Clear throughout to auscultation.    ?Heart:  Regular rate and rhythm. ?Abdomen:  Soft, nontender and nondistended. Normal bowel sounds, without guarding, and without rebound.   ?Neurologic:  Alert and  oriented x4;  grossly normal neurologically. ? ?Impression/Plan: ?Jason [name] is now here to undergo a screening colonoscopy. ? ?Risks, benefits, and alternatives regarding colonoscopy have been reviewed with the patient.  Questions have been answered.  All parties agreeable. ?

## 2021-08-10 NOTE — Anesthesia Postprocedure Evaluation (Signed)
Anesthesia Post Note ? ?Patient: Jason [name] ? ?Procedure(s) Performed: COLONOSCOPY WITH PROPOFOL (Rectum) ? ? ?  ?Patient location during evaluation: PACU ?Anesthesia Type: General ?Level of consciousness: awake and alert ?Pain management: pain level controlled ?Vital Signs Assessment: post-procedure vital signs reviewed and stable ?Respiratory status: spontaneous breathing, nonlabored ventilation and respiratory function stable ?Cardiovascular status: stable and blood pressure returned to baseline ?Postop Assessment: no apparent nausea or vomiting ?Anesthetic complications: no ? ? ?No notable events documented. ? ?Loni Beckwith ? ? ? ? ? ?

## 2021-08-10 NOTE — Transfer of Care (Signed)
Immediate Anesthesia Transfer of Care Note ? ?Patient: Jason [name] ? ?Procedure(s) Performed: COLONOSCOPY WITH PROPOFOL (Rectum) ? ?Patient Location: PACU ? ?Anesthesia Type: General ? ?Level of Consciousness: awake, alert  and patient cooperative ? ?Airway and Oxygen Therapy: Patient Spontanous Breathing and Patient connected to supplemental oxygen ? ?Post-op Assessment: Post-op Vital signs reviewed, Patient's Cardiovascular Status Stable, Respiratory Function Stable, Patent Airway and No signs of Nausea or vomiting ? ?Post-op Vital Signs: Reviewed and stable ? ?Complications: No notable events documented. ? ?

## 2021-08-10 NOTE — Anesthesia Procedure Notes (Signed)
Date/Time: 08/10/2021 9:00 AM ?Performed by: Maree Krabbe, CRNA ?Pre-anesthesia Checklist: Patient identified, Emergency Drugs available, Suction available, Timeout performed and Patient being monitored ?Patient Re-evaluated:Patient Re-evaluated prior to induction ?Oxygen Delivery Method: Nasal cannula ?Placement Confirmation: positive ETCO2 ? ? ? ? ?

## 2021-08-10 NOTE — Anesthesia Preprocedure Evaluation (Addendum)
Anesthesia Evaluation  ?Patient identified by MRN, date of birth, ID band ?Patient awake ? ? ? ?Reviewed: ?Allergy & Precautions, H&P , NPO status , Patient's Chart, lab work & pertinent test results, reviewed documented beta blocker date and time  ? ?Airway ?Mallampati: II ? ?TM Distance: >3 FB ?Neck ROM: full ? ? ? Dental ?no notable dental hx. ? ?  ?Pulmonary ?asthma ( childhood) ,  ?  ?Pulmonary exam normal ?breath sounds clear to auscultation ? ? ? ? ? ? Cardiovascular ?Exercise Tolerance: Good ?hypertension, negative cardio ROS ? ? ?Rhythm:regular Rate:Normal ? ? ?  ?Neuro/Psych ?negative neurological ROS ? negative psych ROS  ? GI/Hepatic ?negative GI ROS, Neg liver ROS,   ?Endo/Other  ?negative endocrine ROS ? Renal/GU ?negative Renal ROS  ?negative genitourinary ?  ?Musculoskeletal ? ? Abdominal ?  ?Peds ? Hematology ?negative hematology ROS ?(+)   ?Anesthesia Other Findings ? ? Reproductive/Obstetrics ?negative OB ROS ? ?  ? ? ? ? ? ? ? ? ? ? ? ? ? ?  ?  ? ? ? ? ? ? ? ? ?Anesthesia Physical ?Anesthesia Plan ? ?ASA: 2 ? ?Anesthesia Plan: General  ? ?Post-op Pain Management:   ? ?Induction:  ? ?PONV Risk Score and Plan: 2 and Midazolam, TIVA and Treatment may vary due to age or medical condition ? ?Airway Management Planned:  ? ?Additional Equipment:  ? ?Intra-op Plan:  ? ?Post-operative Plan:  ? ?Informed Consent: I have reviewed the patients History and Physical, chart, labs and discussed the procedure including the risks, benefits and alternatives for the proposed anesthesia with the patient or authorized representative who has indicated his/her understanding and acceptance.  ? ? ? ?Dental Advisory Given ? ?Plan Discussed with: CRNA ? ?Anesthesia Plan Comments:   ? ? ? ? ? ?Anesthesia Quick Evaluation ? ?

## 2021-08-11 ENCOUNTER — Encounter: Payer: Self-pay | Admitting: Gastroenterology

## 2021-08-18 DIAGNOSIS — H401133 Primary open-angle glaucoma, bilateral, severe stage: Secondary | ICD-10-CM | POA: Diagnosis not present

## 2021-08-31 ENCOUNTER — Ambulatory Visit: Payer: Self-pay | Admitting: Family Medicine

## 2021-09-01 ENCOUNTER — Ambulatory Visit (INDEPENDENT_AMBULATORY_CARE_PROVIDER_SITE_OTHER): Payer: Medicare Other | Admitting: Family Medicine

## 2021-09-01 ENCOUNTER — Encounter: Payer: Self-pay | Admitting: Family Medicine

## 2021-09-01 ENCOUNTER — Other Ambulatory Visit: Payer: Self-pay | Admitting: Family Medicine

## 2021-09-01 VITALS — BP 120/70 | HR 72 | Ht 67.0 in | Wt 182.0 lb

## 2021-09-01 DIAGNOSIS — Z0000 Encounter for general adult medical examination without abnormal findings: Secondary | ICD-10-CM

## 2021-09-01 NOTE — Progress Notes (Signed)
? ? ?Date:  09/01/2021  ? ?Name:  Camarion Weier   DOB:  [birth year]   MRN:  [id number] ? ? ?Chief Complaint: Annual Exam ? ?Patient is a 66 year old male who presents for a comprehensive physical exam. The patient reports the following problems: none. Health maintenance has been reviewed up to date.Lovis More is a 66 y.o. male who presents today for his Complete Annual Exam. He feels well. He reports exercising daily. He reports he is sleeping well.   ?  ? ? ?Lab Results  ?Component Value Date  ? NA 140 07/16/2021  ? K 4.0 07/16/2021  ? CO2 27 07/16/2021  ? GLUCOSE 104 (H) 07/16/2021  ? BUN 14 07/16/2021  ? CREATININE 1.00 07/16/2021  ? CALCIUM 9.8 07/16/2021  ? EGFR 84 07/16/2021  ? GFRNONAA >60 07/21/2020  ? ?Lab Results  ?Component Value Date  ? CHOL 156 07/16/2021  ? HDL 49 07/16/2021  ? [place] 97 07/16/2021  ? TRIG 49 07/16/2021  ? CHOLHDL 3.5 05/25/2017  ? ?Lab Results  ?Component Value Date  ? TSH 1.380 05/25/2017  ? ?Lab Results  ?Component Value Date  ? HGBA1C 4.6 (L) 05/25/2017  ? ?Lab Results  ?Component Value Date  ? WBC 3.7 (L) 07/21/2020  ? HGB 14.1 07/21/2020  ? HCT 43.1 07/21/2020  ? MCV 93.1 07/21/2020  ? PLT 179 07/21/2020  ? ?Lab Results  ?Component Value Date  ? ALT 24 05/25/2017  ? AST 22 05/25/2017  ? ALKPHOS 77 05/25/2017  ? BILITOT 0.5 05/25/2017  ? ?No results found for: 25OHVITD2, [place], VD25OH  ? ?Review of Systems  ?Constitutional:  Negative for chills and fever.  ?HENT:  Negative for drooling, ear discharge, ear pain and sore throat.   ?Respiratory:  Negative for cough, shortness of breath and wheezing.   ?Cardiovascular:  Positive for chest pain. Negative for palpitations and leg swelling.  ?     Right pectoral pain for over a year.  ?Gastrointestinal:  Negative for abdominal pain, blood in stool, constipation, diarrhea and nausea.  ?Endocrine: Negative for polydipsia.  ?Genitourinary:  Negative for dysuria, frequency, hematuria and urgency.  ?Musculoskeletal:  Negative for back  pain, myalgias and neck pain.  ?Skin:  Negative for rash.  ?Allergic/Immunologic: Negative for environmental allergies.  ?Neurological:  Negative for dizziness and headaches.  ?Hematological:  Does not bruise/bleed easily.  ?Psychiatric/Behavioral:  Negative for suicidal ideas. The patient is not nervous/anxious.   ? ?Patient Active Problem List  ? Diagnosis Date Noted  ? Colon cancer screening   ? Non-recurrent bilateral inguinal hernia without obstruction or gangrene   ? Glaucoma 07/18/2020  ? Age-related cataract of both eyes 07/18/2020  ? Essential hypertension 05/25/2017  ? Umbilical hernia without obstruction and without gangrene 05/25/2017  ? Exertional chest pain 05/25/2017  ? Cervical radiculopathy 05/25/2017  ? FH: diabetes mellitus 05/25/2017  ? ? ?Allergies  ?Allergen Reactions  ? Pineapple Swelling  ?  lips  ? ? ?Past Surgical History:  ?Procedure Laterality Date  ? COLONOSCOPY WITH PROPOFOL N/A 08/10/2021  ? Procedure: COLONOSCOPY WITH PROPOFOL;  Surgeon: Lucilla Lame, MD;  Location: [place];  Service: Endoscopy;  Laterality: N/A;  ? SHOULDER ARTHROSCOPY W/ ROTATOR CUFF REPAIR Left 2014  ? TRABECULECTOMY Left   ? UMBILICAL HERNIA REPAIR N/A 07/24/2020  ? Procedure: HERNIA REPAIR UMBILICAL ADULT, open;  Surgeon: Olean Ree, MD;  Location: [facility];  Service: General;  Laterality: N/A;  ? UPPER GI ENDOSCOPY    ? XI  ROBOTIC ASSISTED INGUINAL HERNIA REPAIR WITH MESH Left 07/24/2020  ? Procedure: XI ROBOTIC ASSISTED INGUINAL HERNIA REPAIR WITH MESH;  Surgeon: Olean Ree, MD;  Location: [facility];  Service: General;  Laterality: Left;  ? ? ?Social History  ? ?Tobacco Use  ? Smoking status: Never  ? Smokeless tobacco: Never  ?Vaping Use  ? Vaping Use: Never used  ?Substance Use Topics  ? Alcohol use: Yes  ?  Alcohol/week: 10.0 standard drinks  ?  Types: 10 Cans of beer per week  ? Drug use: Yes  ?  Types: Marijuana  ?  Comment: Occasionally  ? ? ? ?Medication list has been reviewed and  updated. ? ?Current Meds  ?Medication Sig  ? acetaminophen (TYLENOL) 500 MG tablet Take 2 tablets (1,000 mg total) by mouth every 6 (six) hours as needed for mild pain.  ? amLODipine (NORVASC) 5 MG tablet Take 5 mg by mouth daily.  ? Ascorbic Acid (VITAMIN C) 1000 MG tablet Take 1,000 mg by mouth daily.  ? cholecalciferol (VITAMIN D3) 25 MCG (1000 UNIT) tablet Take 1,000 Units by mouth daily.  ? Cod Liver Oil 1000 MG CAPS Take 1,000 mg by mouth daily.  ? dextromethorphan-guaiFENesin (MUCINEX DM) 30-600 MG 12hr tablet Take 1 tablet by mouth 2 (two) times daily as needed for cough.  ? doxazosin (CARDURA) 1 MG tablet Take 1 tablet (1 mg total) by mouth daily.  ? ibuprofen (ADVIL) 600 MG tablet Take 1 tablet (600 mg total) by mouth every 8 (eight) hours as needed for moderate pain.  ? latanoprost (XALATAN) 0.005 % ophthalmic solution 1 drop at bedtime.  ? Misc Natural Products (HORNY GOAT WEED PO) Take by mouth.  ? Multiple Vitamins-Minerals (MULTIVITAMIN MEN PO) Take by mouth. Mega Man  ? valsartan-hydrochlorothiazide (DIOVAN-HCT) 160-25 MG tablet Take 1 tablet by mouth daily.  ? ? ? ?  09/01/2021  ? 10:11 AM 07/16/2021  ?  8:54 AM 07/21/2020  ? 10:48 AM  ?GAD 7 : Generalized Anxiety Score  ?Nervous, Anxious, on Edge 0 0 0  ?Control/stop worrying 0 0 0  ?Worry too much - different things 0 0 0  ?Trouble relaxing 0 0 0  ?Restless 0 0 0  ?Easily annoyed or irritable 0 0 0  ?Afraid - awful might happen 0 0 0  ?Total GAD 7 Score 0 0 0  ?Anxiety Difficulty Not difficult at all Not difficult at all Not difficult at all  ? ? ? ?  09/01/2021  ? 10:10 AM  ?Depression screen PHQ 2/9  ?Decreased Interest 0  ?Down, Depressed, Hopeless 0  ?PHQ - 2 Score 0  ?Altered sleeping 0  ?Tired, decreased energy 0  ?Change in appetite 0  ?Feeling bad or failure about yourself  0  ?Trouble concentrating 0  ?Moving slowly or fidgety/restless 0  ?Suicidal thoughts 0  ?PHQ-9 Score 0  ?Difficult doing work/chores Not difficult at all  ? ? ?BP Readings  from Last 3 Encounters:  ?09/01/21 120/70  ?08/10/21 114/85  ?07/16/21 122/78  ? ? ?Physical Exam ?Vitals and nursing note reviewed.  ?Constitutional:   ?   Appearance: Normal appearance.  ?HENT:  ?   Head: Normocephalic.  ?   Jaw: There is normal jaw occlusion.  ?   Right Ear: Tympanic membrane, ear canal and external ear normal. There is no impacted cerumen.  ?   Left Ear: Tympanic membrane, ear canal and external ear normal. There is no impacted cerumen.  ?   Nose: Nose  normal. No congestion or rhinorrhea.  ?   Right Sinus: No maxillary sinus tenderness or frontal sinus tenderness.  ?   Left Sinus: No maxillary sinus tenderness or frontal sinus tenderness.  ?   Mouth/Throat:  ?   Lips: Pink.  ?   Mouth: Mucous membranes are moist.  ?   Dentition: Normal dentition.  ?   Tongue: No lesions.  ?   Palate: No mass.  ?   Pharynx: Oropharynx is clear. Uvula midline.  ?Eyes:  ?   General: Lids are normal. Vision grossly intact. No scleral icterus.    ?   Right eye: No discharge.     ?   Left eye: No discharge.  ?   Extraocular Movements: Extraocular movements intact.  ?   Conjunctiva/sclera: Conjunctivae normal.  ?   Pupils: Pupils are equal, round, and reactive to light.  ?   Funduscopic exam: ?   Right eye: Red reflex present.     ?   Left eye: Red reflex present. ?Neck:  ?   Thyroid: No thyroid mass, thyromegaly or thyroid tenderness.  ?   Vascular: Normal carotid pulses. No carotid bruit, hepatojugular reflux or JVD.  ?   Trachea: Trachea and phonation normal. No tracheal deviation.  ?Cardiovascular:  ?   Rate and Rhythm: Normal rate and regular rhythm.  ?   Pulses: Normal pulses.  ?   Heart sounds: Normal heart sounds, S1 normal and S2 normal. No murmur heard. ?No systolic murmur is present.  ?No diastolic murmur is present.  ?  No friction rub. No gallop. No S3 or S4 sounds.  ?Pulmonary:  ?   Effort: No respiratory distress.  ?   Breath sounds: Normal breath sounds. No decreased breath sounds, wheezing, rhonchi or  rales.  ?Chest:  ?   Chest wall: No mass.  ?Breasts: ?   Right: Normal.  ?   Left: Normal.  ?Abdominal:  ?   General: Bowel sounds are normal.  ?   Palpations: Abdomen is soft. There is no hepatomegaly, splenomeg

## 2021-09-02 LAB — CBC WITH DIFFERENTIAL/PLATELET
Basophils Absolute: 0 10*3/uL (ref 0.0–0.2)
Basos: 1 %
EOS (ABSOLUTE): 0.1 10*3/uL (ref 0.0–0.4)
Eos: 2 %
Hematocrit: 44.6 % (ref 37.5–51.0)
Hemoglobin: 15.1 g/dL (ref 13.0–17.7)
Immature Grans (Abs): 0 10*3/uL (ref 0.0–0.1)
Immature Granulocytes: 0 %
Lymphocytes Absolute: 1.5 10*3/uL (ref 0.7–3.1)
Lymphs: 32 %
MCH: 31.1 pg (ref 26.6–33.0)
MCHC: 33.9 g/dL (ref 31.5–35.7)
MCV: 92 fL (ref 79–97)
Monocytes Absolute: 0.6 10*3/uL (ref 0.1–0.9)
Monocytes: 13 %
Neutrophils Absolute: 2.5 10*3/uL (ref 1.4–7.0)
Neutrophils: 52 %
Platelets: 209 10*3/uL (ref 150–450)
RBC: 4.86 x10E6/uL (ref 4.14–5.80)
RDW: 10.7 % — ABNORMAL LOW (ref 11.6–15.4)
WBC: 4.8 10*3/uL (ref 3.4–10.8)

## 2021-09-07 ENCOUNTER — Encounter: Payer: Self-pay | Admitting: Family Medicine

## 2021-09-07 ENCOUNTER — Ambulatory Visit (INDEPENDENT_AMBULATORY_CARE_PROVIDER_SITE_OTHER): Payer: Medicare Other | Admitting: Family Medicine

## 2021-09-07 VITALS — BP 146/94 | HR 69 | Ht 67.0 in | Wt 182.0 lb

## 2021-09-07 DIAGNOSIS — I10 Essential (primary) hypertension: Secondary | ICD-10-CM

## 2021-09-07 DIAGNOSIS — R0789 Other chest pain: Secondary | ICD-10-CM

## 2021-09-07 NOTE — Progress Notes (Signed)
? ? ?Date:  09/07/2021  ? ?Name:  Jason [name]   DOB:  1956/02/27   MRN:  [id number] ? ? ?Chief Complaint: Chest Pain (Either be when lying or at rest. Goes to gym daily and lifts weights. Last time had episode was this morning- lasted about 20 minutes) ? ?Chest Pain  ?This is a new problem. The current episode started more than 1 month ago (6 months). The onset quality is gradual. The problem occurs intermittently. The problem has been unchanged. The pain is present in the substernal region (just lateral to right). The pain is at a severity of 2/10. The pain is mild. The quality of the pain is described as dull and numbness. The pain does not radiate. Pertinent negatives include no abdominal pain, back pain, cough, dizziness, exertional chest pressure, fever, headaches, nausea, near-syncope, palpitations or shortness of breath. He has tried rest for the symptoms. Risk factors: pullups.  ? ?Lab Results  ?Component Value Date  ? NA 140 07/16/2021  ? K 4.0 07/16/2021  ? CO2 27 07/16/2021  ? GLUCOSE 104 (H) 07/16/2021  ? BUN 14 07/16/2021  ? CREATININE 1.00 07/16/2021  ? CALCIUM 9.8 07/16/2021  ? EGFR 84 07/16/2021  ? GFRNONAA >60 07/21/2020  ? ?Lab Results  ?Component Value Date  ? CHOL 156 07/16/2021  ? HDL 49 07/16/2021  ? LDLCALC 97 07/16/2021  ? TRIG 49 07/16/2021  ? CHOLHDL 3.5 05/25/2017  ? ?Lab Results  ?Component Value Date  ? TSH 1.380 05/25/2017  ? ?Lab Results  ?Component Value Date  ? HGBA1C 4.6 (L) 05/25/2017  ? ?Lab Results  ?Component Value Date  ? WBC 4.8 09/01/2021  ? HGB 15.1 09/01/2021  ? HCT 44.6 09/01/2021  ? MCV 92 09/01/2021  ? PLT 209 09/01/2021  ? ?Lab Results  ?Component Value Date  ? ALT 24 05/25/2017  ? AST 22 05/25/2017  ? ALKPHOS 77 05/25/2017  ? BILITOT 0.5 05/25/2017  ? ?No results found for: 25OHVITD2, 25OHVITD3, VD25OH  ? ?Review of Systems  ?Constitutional:  Negative for chills and fever.  ?HENT:  Negative for drooling, ear discharge, ear pain and sore throat.   ?Respiratory:  Negative  for cough, shortness of breath and wheezing.   ?Cardiovascular:  Positive for chest pain. Negative for palpitations, leg swelling and near-syncope.  ?Gastrointestinal:  Negative for abdominal pain, blood in stool, constipation, diarrhea and nausea.  ?Endocrine: Negative for polydipsia.  ?Genitourinary:  Negative for dysuria, frequency, hematuria and urgency.  ?Musculoskeletal:  Negative for back pain, myalgias and neck pain.  ?Skin:  Negative for rash.  ?Allergic/Immunologic: Negative for environmental allergies.  ?Neurological:  Negative for dizziness and headaches.  ?Hematological:  Does not bruise/bleed easily.  ?Psychiatric/Behavioral:  Negative for suicidal ideas. The patient is not nervous/anxious.   ? ?Patient Active Problem List  ? Diagnosis Date Noted  ? Colon cancer screening   ? Non-recurrent bilateral inguinal hernia without obstruction or gangrene   ? Glaucoma 07/18/2020  ? Age-related cataract of both eyes 07/18/2020  ? Essential hypertension 05/25/2017  ? Umbilical hernia without obstruction and without gangrene 05/25/2017  ? Exertional chest pain 05/25/2017  ? Cervical radiculopathy 05/25/2017  ? FH: diabetes mellitus 05/25/2017  ? ? ?Allergies  ?Allergen Reactions  ? Pineapple Swelling  ?  lips  ? ? ?Past Surgical History:  ?Procedure Laterality Date  ? COLONOSCOPY WITH PROPOFOL N/A 08/10/2021  ? Procedure: COLONOSCOPY WITH PROPOFOL;  Surgeon: Midge Minium, MD;  Location: [facility];  Service: Endoscopy;  Laterality: N/A;  ? SHOULDER ARTHROSCOPY W/ ROTATOR CUFF REPAIR Left 2014  ? TRABECULECTOMY Left   ? UMBILICAL HERNIA REPAIR N/A 07/24/2020  ? Procedure: HERNIA REPAIR UMBILICAL ADULT, open;  Surgeon: Olean Ree, MD;  Location: [facility];  Service: General;  Laterality: N/A;  ? UPPER GI ENDOSCOPY    ? XI ROBOTIC ASSISTED INGUINAL HERNIA REPAIR WITH MESH Left 07/24/2020  ? Procedure: XI ROBOTIC ASSISTED INGUINAL HERNIA REPAIR WITH MESH;  Surgeon: Olean Ree, MD;  Location: [facility];   Service: General;  Laterality: Left;  ? ? ?Social History  ? ?Tobacco Use  ? Smoking status: Never  ? Smokeless tobacco: Never  ?Vaping Use  ? Vaping Use: Never used  ?Substance Use Topics  ? Alcohol use: Yes  ?  Alcohol/week: 10.0 standard drinks  ?  Types: 10 Cans of beer per week  ? Drug use: Yes  ?  Types: Marijuana  ?  Comment: Occasionally  ? ? ? ?Medication list has been reviewed and updated. ? ?Current Meds  ?Medication Sig  ? acetaminophen (TYLENOL) 500 MG tablet Take 2 tablets (1,000 mg total) by mouth every 6 (six) hours as needed for mild pain.  ? amLODipine (NORVASC) 5 MG tablet Take 5 mg by mouth daily.  ? Ascorbic Acid (VITAMIN C) 1000 MG tablet Take 1,000 mg by mouth daily.  ? cholecalciferol (VITAMIN D3) 25 MCG (1000 UNIT) tablet Take 1,000 Units by mouth daily.  ? Cod Liver Oil 1000 MG CAPS Take 1,000 mg by mouth daily.  ? dextromethorphan-guaiFENesin (MUCINEX DM) 30-600 MG 12hr tablet Take 1 tablet by mouth 2 (two) times daily as needed for cough.  ? doxazosin (CARDURA) 1 MG tablet Take 1 tablet (1 mg total) by mouth daily.  ? ibuprofen (ADVIL) 600 MG tablet Take 1 tablet (600 mg total) by mouth every 8 (eight) hours as needed for moderate pain.  ? latanoprost (XALATAN) 0.005 % ophthalmic solution 1 drop at bedtime.  ? Misc Natural Products (HORNY GOAT WEED PO) Take by mouth.  ? Multiple Vitamins-Minerals (MULTIVITAMIN MEN PO) Take by mouth. Mega Man  ? valsartan-hydrochlorothiazide (DIOVAN-HCT) 160-25 MG tablet Take 1 tablet by mouth daily.  ? ? ? ?  09/07/2021  ? 10:20 AM 09/01/2021  ? 10:11 AM 07/16/2021  ?  8:54 AM 07/21/2020  ? 10:48 AM  ?GAD 7 : Generalized Anxiety Score  ?Nervous, Anxious, on Edge 0 0 0 0  ?Control/stop worrying 0 0 0 0  ?Worry too much - different things 0 0 0 0  ?Trouble relaxing 0 0 0 0  ?Restless 0 0 0 0  ?Easily annoyed or irritable 0 0 0 0  ?Afraid - awful might happen 0 0 0 0  ?Total GAD 7 Score 0 0 0 0  ?Anxiety Difficulty Not difficult at all Not difficult at all Not  difficult at all Not difficult at all  ? ? ? ?  09/07/2021  ? 10:20 AM  ?Depression screen PHQ 2/9  ?Decreased Interest 0  ?Down, Depressed, Hopeless 0  ?PHQ - 2 Score 0  ?Altered sleeping 0  ?Tired, decreased energy 0  ?Change in appetite 0  ?Feeling bad or failure about yourself  0  ?Trouble concentrating 0  ?Moving slowly or fidgety/restless 0  ?Suicidal thoughts 0  ?PHQ-9 Score 0  ?Difficult doing work/chores Not difficult at all  ? ? ?BP Readings from Last 3 Encounters:  ?09/07/21 (!) 146/90  ?09/01/21 120/70  ?08/10/21 114/85  ? ? ?Physical Exam ?Vitals and  nursing note reviewed.  ?HENT:  ?   Head: Normocephalic.  ?   Right Ear: External ear normal.  ?   Left Ear: External ear normal.  ?   Nose: Nose normal.  ?Eyes:  ?   General: No scleral icterus.    ?   Right eye: No discharge.     ?   Left eye: No discharge.  ?   Conjunctiva/sclera: Conjunctivae normal.  ?   Pupils: Pupils are equal, round, and reactive to light.  ?Neck:  ?   Thyroid: No thyromegaly.  ?   Vascular: No JVD.  ?   Trachea: No tracheal deviation.  ?Cardiovascular:  ?   Rate and Rhythm: Normal rate and regular rhythm.  ?   Heart sounds: Normal heart sounds, S1 normal and S2 normal. No murmur heard. ?No systolic murmur is present.  ?No diastolic murmur is present.  ?  No friction rub. No gallop. No S3 or S4 sounds.  ?Pulmonary:  ?   Effort: No respiratory distress.  ?   Breath sounds: Normal breath sounds. No decreased breath sounds, wheezing, rhonchi or rales.  ?Chest:  ?   Chest wall: No deformity or tenderness.  ?Abdominal:  ?   General: Bowel sounds are normal.  ?   Palpations: Abdomen is soft. There is no mass.  ?   Tenderness: There is no abdominal tenderness. There is no guarding or rebound.  ?Musculoskeletal:     ?   General: No tenderness. Normal range of motion.  ?   Cervical back: Normal range of motion and neck supple.  ?   Right lower leg: No edema.  ?   Left lower leg: No edema.  ?Lymphadenopathy:  ?   Cervical: No cervical  adenopathy.  ?Skin: ?   General: Skin is warm.  ?   Findings: No rash.  ?Neurological:  ?   Mental Status: He is alert and oriented to person, place, and time.  ?   Cranial Nerves: No cranial nerve deficit.  ?   [employer]

## 2021-09-08 DIAGNOSIS — R079 Chest pain, unspecified: Secondary | ICD-10-CM | POA: Diagnosis not present

## 2021-09-08 DIAGNOSIS — I10 Essential (primary) hypertension: Secondary | ICD-10-CM | POA: Diagnosis not present

## 2021-09-08 DIAGNOSIS — I208 Other forms of angina pectoris: Secondary | ICD-10-CM | POA: Diagnosis not present

## 2021-09-08 DIAGNOSIS — E782 Mixed hyperlipidemia: Secondary | ICD-10-CM | POA: Diagnosis not present

## 2021-09-11 ENCOUNTER — Ambulatory Visit (INDEPENDENT_AMBULATORY_CARE_PROVIDER_SITE_OTHER): Payer: Medicare Other | Admitting: Family Medicine

## 2021-09-11 ENCOUNTER — Encounter: Payer: Self-pay | Admitting: Family Medicine

## 2021-09-11 VITALS — BP 120/72 | HR 64 | Ht 67.0 in | Wt 186.0 lb

## 2021-09-11 DIAGNOSIS — I10 Essential (primary) hypertension: Secondary | ICD-10-CM | POA: Diagnosis not present

## 2021-09-11 MED ORDER — VALSARTAN-HYDROCHLOROTHIAZIDE 160-25 MG PO TABS: 1.0000 | ORAL_TABLET | Freq: Every day | ORAL | 1 refills | Status: DC

## 2021-09-11 MED ORDER — AMLODIPINE BESYLATE 5 MG PO TABS: 5.0000 mg | ORAL_TABLET | Freq: Every day | ORAL | 1 refills | Status: DC

## 2021-09-11 NOTE — Progress Notes (Signed)
? ? ?Date:  09/11/2021  ? ?Name:  Jason [name]   DOB:  27-Jan-1956   MRN:  [id number] ? ? ?Chief Complaint: Hypertension ? ?Hypertension ?This is a chronic problem. The current episode started more than 1 year ago. The problem has been gradually improving since onset. The problem is controlled. Pertinent negatives include no chest pain, headaches, neck pain, palpitations or shortness of breath. Past treatments include angiotensin blockers and calcium channel blockers. The current treatment provides moderate improvement. There are no compliance problems.   ? ?Lab Results  ?Component Value Date  ? NA 140 07/16/2021  ? K 4.0 07/16/2021  ? CO2 27 07/16/2021  ? GLUCOSE 104 (H) 07/16/2021  ? BUN 14 07/16/2021  ? CREATININE 1.00 07/16/2021  ? CALCIUM 9.8 07/16/2021  ? EGFR 84 07/16/2021  ? GFRNONAA >60 07/21/2020  ? ?Lab Results  ?Component Value Date  ? CHOL 156 07/16/2021  ? HDL 49 07/16/2021  ? [place] 97 07/16/2021  ? TRIG 49 07/16/2021  ? CHOLHDL 3.5 05/25/2017  ? ?Lab Results  ?Component Value Date  ? TSH 1.380 05/25/2017  ? ?Lab Results  ?Component Value Date  ? HGBA1C 4.6 (L) 05/25/2017  ? ?Lab Results  ?Component Value Date  ? WBC 4.8 09/01/2021  ? HGB 15.1 09/01/2021  ? HCT 44.6 09/01/2021  ? MCV 92 09/01/2021  ? PLT 209 09/01/2021  ? ?Lab Results  ?Component Value Date  ? ALT 24 05/25/2017  ? AST 22 05/25/2017  ? ALKPHOS 77 05/25/2017  ? BILITOT 0.5 05/25/2017  ? ?No results found for: 25OHVITD2, [place], VD25OH  ? ?Review of Systems  ?Constitutional:  Negative for chills and fever.  ?HENT:  Negative for drooling, ear discharge, ear pain and sore throat.   ?Respiratory:  Negative for cough, shortness of breath and wheezing.   ?Cardiovascular:  Negative for chest pain, palpitations and leg swelling.  ?Gastrointestinal:  Negative for abdominal pain, blood in stool, constipation, diarrhea and nausea.  ?Endocrine: Negative for polydipsia.  ?Genitourinary:  Negative for dysuria, frequency, hematuria and urgency.   ?Musculoskeletal:  Negative for back pain, myalgias and neck pain.  ?Skin:  Negative for rash.  ?Allergic/Immunologic: Negative for environmental allergies.  ?Neurological:  Negative for dizziness and headaches.  ?Hematological:  Does not bruise/bleed easily.  ?Psychiatric/Behavioral:  Negative for suicidal ideas. The patient is not nervous/anxious.   ? ?Patient Active Problem List  ? Diagnosis Date Noted  ? Colon cancer screening   ? Non-recurrent bilateral inguinal hernia without obstruction or gangrene   ? Glaucoma 07/18/2020  ? Age-related cataract of both eyes 07/18/2020  ? Essential hypertension 05/25/2017  ? Umbilical hernia without obstruction and without gangrene 05/25/2017  ? Exertional chest pain 05/25/2017  ? Cervical radiculopathy 05/25/2017  ? FH: diabetes mellitus 05/25/2017  ? ? ?Allergies  ?Allergen Reactions  ? Pineapple Swelling  ?  lips  ? ? ?Past Surgical History:  ?Procedure Laterality Date  ? COLONOSCOPY WITH PROPOFOL N/A 08/10/2021  ? Procedure: COLONOSCOPY WITH PROPOFOL;  Surgeon: Lucilla Lame, MD;  Location: [place];  Service: Endoscopy;  Laterality: N/A;  ? SHOULDER ARTHROSCOPY W/ ROTATOR CUFF REPAIR Left 2014  ? TRABECULECTOMY Left   ? UMBILICAL HERNIA REPAIR N/A 07/24/2020  ? Procedure: HERNIA REPAIR UMBILICAL ADULT, open;  Surgeon: Olean Ree, MD;  Location: [facility];  Service: General;  Laterality: N/A;  ? UPPER GI ENDOSCOPY    ? XI ROBOTIC ASSISTED INGUINAL HERNIA REPAIR WITH MESH Left 07/24/2020  ? Procedure: XI ROBOTIC ASSISTED  INGUINAL HERNIA REPAIR WITH MESH;  Surgeon: Olean Ree, MD;  Location: [facility];  Service: General;  Laterality: Left;  ? ? ?Social History  ? ?Tobacco Use  ? Smoking status: Never  ? Smokeless tobacco: Never  ?Vaping Use  ? Vaping Use: Never used  ?Substance Use Topics  ? Alcohol use: Yes  ?  Alcohol/week: 10.0 standard drinks  ?  Types: 10 Cans of beer per week  ? Drug use: Yes  ?  Types: Marijuana  ?  Comment: Occasionally   ? ? ? ?Medication list has been reviewed and updated. ? ?Current Meds  ?Medication Sig  ? acetaminophen (TYLENOL) 500 MG tablet Take 2 tablets (1,000 mg total) by mouth every 6 (six) hours as needed for mild pain.  ? amLODipine (NORVASC) 5 MG tablet Take 5 mg by mouth daily.  ? Ascorbic Acid (VITAMIN C) 1000 MG tablet Take 1,000 mg by mouth daily.  ? cholecalciferol (VITAMIN D3) 25 MCG (1000 UNIT) tablet Take 1,000 Units by mouth daily.  ? Cod Liver Oil 1000 MG CAPS Take 1,000 mg by mouth daily.  ? dextromethorphan-guaiFENesin (MUCINEX DM) 30-600 MG 12hr tablet Take 1 tablet by mouth 2 (two) times daily as needed for cough.  ? doxazosin (CARDURA) 1 MG tablet Take 1 tablet (1 mg total) by mouth daily.  ? ibuprofen (ADVIL) 600 MG tablet Take 1 tablet (600 mg total) by mouth every 8 (eight) hours as needed for moderate pain.  ? latanoprost (XALATAN) 0.005 % ophthalmic solution 1 drop at bedtime.  ? Misc Natural Products (HORNY GOAT WEED PO) Take by mouth.  ? Multiple Vitamins-Minerals (MULTIVITAMIN MEN PO) Take by mouth. Mega Man  ? valsartan-hydrochlorothiazide (DIOVAN-HCT) 160-25 MG tablet Take 1 tablet by mouth daily.  ? ? ? ?  09/11/2021  ? 10:59 AM 09/07/2021  ? 10:20 AM 09/01/2021  ? 10:11 AM 07/16/2021  ?  8:54 AM  ?GAD 7 : Generalized Anxiety Score  ?Nervous, Anxious, on Edge 0 0 0 0  ?Control/stop worrying 0 0 0 0  ?Worry too much - different things 0 0 0 0  ?Trouble relaxing 0 0 0 0  ?Restless 0 0 0 0  ?Easily annoyed or irritable 0 0 0 0  ?Afraid - awful might happen 0 0 0 0  ?Total GAD 7 Score 0 0 0 0  ?Anxiety Difficulty Not difficult at all Not difficult at all Not difficult at all Not difficult at all  ? ? ? ?  09/11/2021  ? 10:59 AM  ?Depression screen PHQ 2/9  ?Decreased Interest 0  ?Down, Depressed, Hopeless 0  ?PHQ - 2 Score 0  ?Altered sleeping 0  ?Tired, decreased energy 0  ?Change in appetite 0  ?Feeling bad or failure about yourself  0  ?Trouble concentrating 0  ?Moving slowly or fidgety/restless 0   ?Suicidal thoughts 0  ?PHQ-9 Score 0  ?Difficult doing work/chores Not difficult at all  ? ? ?BP Readings from Last 3 Encounters:  ?09/11/21 120/72  ?09/07/21 (!) 146/94  ?09/01/21 120/70  ? ? ?Physical Exam ?Vitals and nursing note reviewed.  ?HENT:  ?   Head: Normocephalic.  ?   Right Ear: External ear normal.  ?   Left Ear: External ear normal.  ?   Nose: Nose normal.  ?Eyes:  ?   General: No scleral icterus.    ?   Right eye: No discharge.     ?   Left eye: No discharge.  ?   Conjunctiva/sclera:  Conjunctivae normal.  ?   Pupils: Pupils are equal, round, and reactive to light.  ?Neck:  ?   Thyroid: No thyromegaly.  ?   Vascular: No JVD.  ?   Trachea: No tracheal deviation.  ?Cardiovascular:  ?   Rate and Rhythm: Normal rate and regular rhythm.  ?   Heart sounds: Normal heart sounds, S1 normal and S2 normal. No murmur heard. ?No systolic murmur is present.  ?No diastolic murmur is present.  ?  No friction rub. No gallop. No S3 or S4 sounds.  ?Pulmonary:  ?   Effort: No respiratory distress.  ?   Breath sounds: Normal breath sounds. No wheezing, rhonchi or rales.  ?Abdominal:  ?   General: Bowel sounds are normal.  ?   Palpations: Abdomen is soft. There is no mass.  ?   Tenderness: There is no abdominal tenderness. There is no guarding or rebound.  ?Musculoskeletal:     ?   General: No tenderness. Normal range of motion.  ?   Cervical back: Normal range of motion and neck supple.  ?Lymphadenopathy:  ?   Cervical: No cervical adenopathy.  ?Skin: ?   General: Skin is warm.  ?   Findings: No rash.  ?Neurological:  ?   Mental Status: He is alert and oriented to person, place, and time.  ?   Cranial Nerves: No cranial nerve deficit.  ?   Deep Tendon Reflexes: Reflexes are normal and symmetric.  ? ? ?Wt Readings from Last 3 Encounters:  ?09/11/21 186 lb (84.4 kg)  ?09/07/21 182 lb (82.6 kg)  ?09/01/21 182 lb (82.6 kg)  ? ? ?BP 120/72   Pulse 64   Ht $R'5\' 7"'wL$  (1.702 m)   Wt 186 lb (84.4 kg)   BMI 29.13 kg/m?   ? ?Assessment and Plan: ? ?1. Essential hypertension ?Chronic.  Controlled.  Stable.  Blood pressure today is 120/72.  Continue amlodipine 5 mg once a day and valsartan hydrochlorothiazide 1 60-25 once a day.  We will recheck in 6 months ?-

## 2021-09-17 DIAGNOSIS — H35352 Cystoid macular degeneration, left eye: Secondary | ICD-10-CM | POA: Diagnosis not present

## 2021-09-17 DIAGNOSIS — H401133 Primary open-angle glaucoma, bilateral, severe stage: Secondary | ICD-10-CM | POA: Diagnosis not present

## 2021-09-17 DIAGNOSIS — H3589 Other specified retinal disorders: Secondary | ICD-10-CM | POA: Diagnosis not present

## 2021-09-29 DIAGNOSIS — H401133 Primary open-angle glaucoma, bilateral, severe stage: Secondary | ICD-10-CM | POA: Diagnosis not present

## 2021-10-01 DIAGNOSIS — I208 Other forms of angina pectoris: Secondary | ICD-10-CM | POA: Diagnosis not present

## 2021-10-01 DIAGNOSIS — R9431 Abnormal electrocardiogram [ECG] [EKG]: Secondary | ICD-10-CM | POA: Diagnosis not present

## 2021-10-15 DIAGNOSIS — H35352 Cystoid macular degeneration, left eye: Secondary | ICD-10-CM | POA: Diagnosis not present

## 2021-10-20 DIAGNOSIS — E782 Mixed hyperlipidemia: Secondary | ICD-10-CM | POA: Diagnosis not present

## 2021-12-08 ENCOUNTER — Ambulatory Visit: Payer: Medicare Other | Admitting: Family Medicine

## 2021-12-30 ENCOUNTER — Telehealth: Payer: Self-pay | Admitting: *Deleted

## 2021-12-30 NOTE — Patient Outreach (Signed)
  Care Coordination   12/30/2021 Name: Jason [name] MRN: [id number] DOB: 01/04/1956   Care Coordination Outreach Attempts:  An unsuccessful telephone outreach was attempted today to offer the patient information about available care coordination services as a benefit of their health plan.   Follow Up Plan:  Additional outreach attempts will be made to offer the patient care coordination information and services.   Encounter Outcome:  No Answer  Care Coordination Interventions Activated:  No   Care Coordination Interventions:  No, not indicated    Blanchie Serve RN, BSN [facility] Care Management [facility] [phone] [place] [email or web address]

## 2022-01-12 ENCOUNTER — Ambulatory Visit: Payer: Medicare Other | Admitting: Family Medicine

## 2022-01-14 ENCOUNTER — Other Ambulatory Visit: Payer: Self-pay | Admitting: Family Medicine

## 2022-01-14 DIAGNOSIS — N401 Enlarged prostate with lower urinary tract symptoms: Secondary | ICD-10-CM

## 2022-01-14 DIAGNOSIS — I10 Essential (primary) hypertension: Secondary | ICD-10-CM

## 2022-01-14 NOTE — Telephone Encounter (Signed)
Requested Prescriptions  Pending Prescriptions Disp Refills  . doxazosin (CARDURA) 1 MG tablet [Pharmacy Med Name: DOXAZOSIN MESYLATE 1 MG TAB] 90 tablet 0    Sig: TAKE 1 TABLET BY MOUTH EVERY DAY     Cardiovascular:  Alpha Blockers Passed - 01/14/2022  2:23 AM      Passed - Last BP in normal range    BP Readings from Last 1 Encounters:  09/11/21 120/72         Passed - Valid encounter within last 6 months    Recent Outpatient Visits          4 months ago Essential hypertension   [place] [facility]   4 months ago Other chest pain   [place] [facility]   4 months ago Annual physical exam   [facility]   6 months ago Essential hypertension   [place] [facility]   1 year ago Essential hypertension   [place] [facility]      Future Appointments            In 2 months Duanne Limerick, MD [facility] at [facility]   In 7 months Duanne Limerick, MD [facility] at [facility]           . amLODipine (NORVASC) 5 MG tablet [Pharmacy Med Name: AMLODIPINE BESYLATE 5 MG TAB] 90 tablet 0    Sig: TAKE 1 TABLET (5 MG TOTAL) BY MOUTH DAILY.     Cardiovascular: Calcium Channel Blockers 2 Passed - 01/14/2022  2:23 AM      Passed - Last BP in normal range    BP Readings from Last 1 Encounters:  09/11/21 120/72         Passed - Last Heart Rate in normal range    Pulse Readings from Last 1 Encounters:  09/11/21 64         Passed - Valid encounter within last 6 months    Recent Outpatient Visits          4 months ago Essential hypertension   [place] [facility]   4 months ago Other chest pain   [place] [facility]   4 months ago Annual physical exam   [facility] at [facility]   6 months ago Essential hypertension   [place] [facility]   1 year ago Essential hypertension    [facility] at [facility]      Future Appointments            In 2 months Duanne Limerick, MD [facility] at [facility]   In 7 months Duanne Limerick, MD [facility] and [facility] Medicine at [facility]

## 2022-02-10 IMAGING — CR DG CHEST 2V
2 series · 2 of 2 positions shown · non-contrast
Comparison: Chest radiograph January 24, 2006.

CLINICAL DATA: Right posterolateral chest wall pain

EXAM:
CHEST - 2 VIEW

[chest pa]
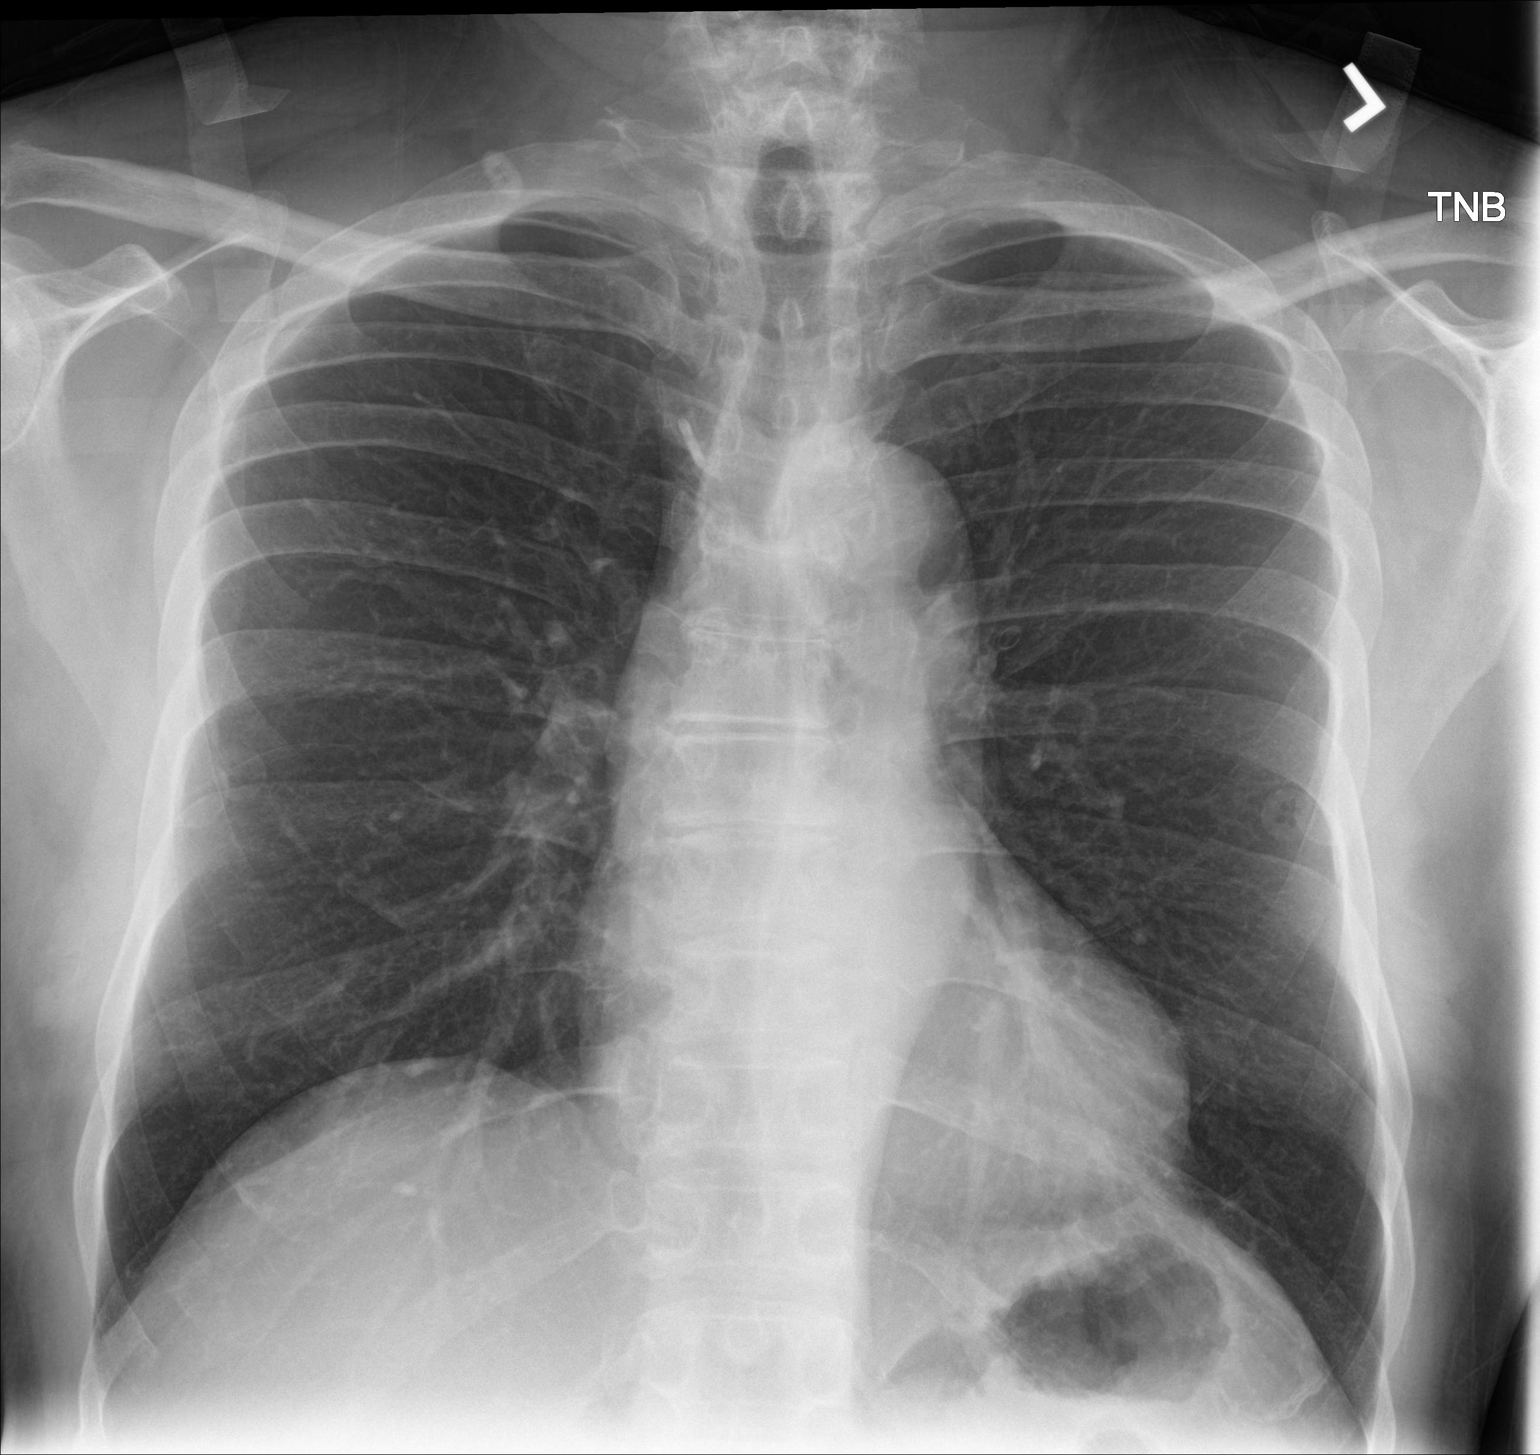

[chest lat]
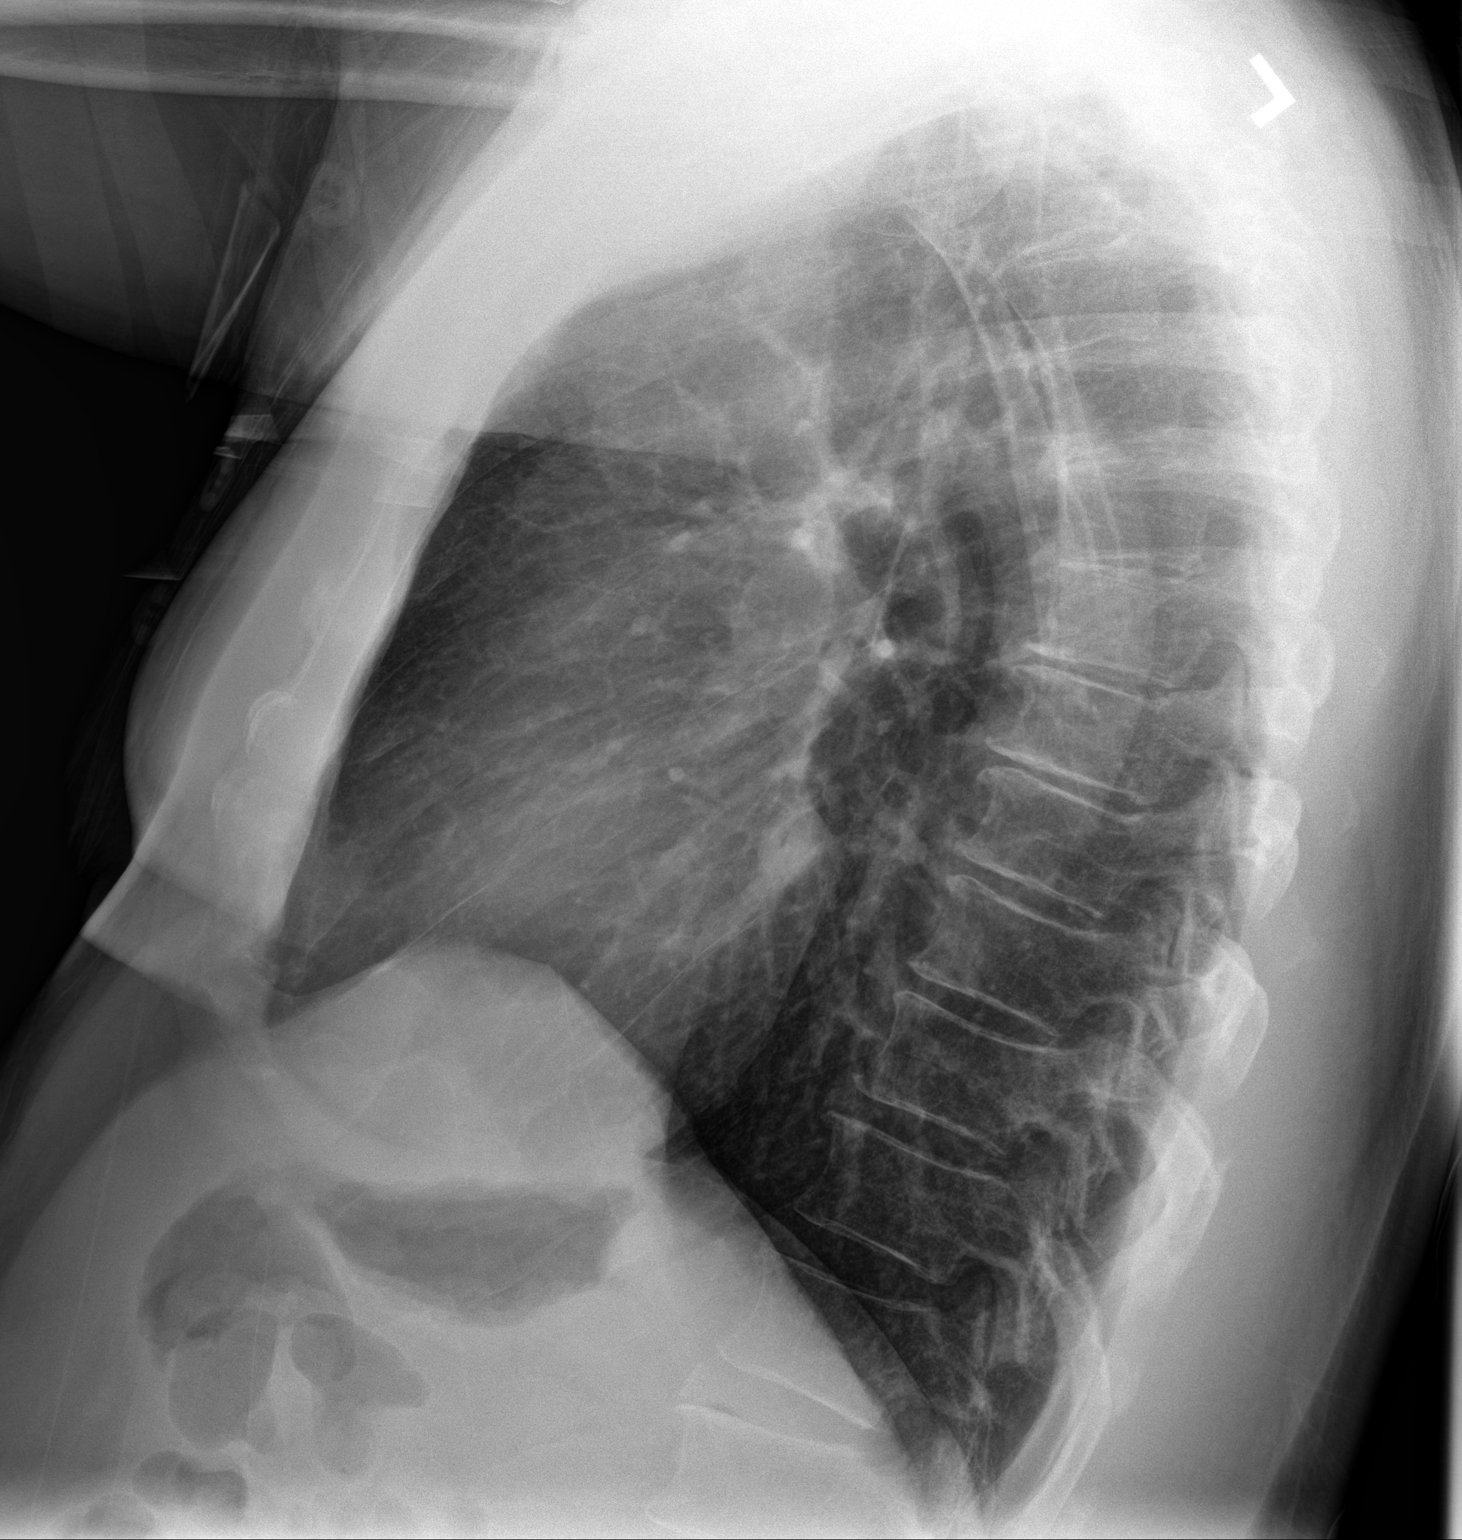

[2 of 2 positions shown; findings below may reference images not displayed]

FINDINGS: The heart size and mediastinal contours are within normal limits. No
focal consolidation. No pleural effusion or pneumothorax. The
visualized skeletal structures are unremarkable.
IMPRESSION: No acute abnormality.

## 2022-02-15 ENCOUNTER — Telehealth: Payer: Self-pay | Admitting: Family Medicine

## 2022-02-15 NOTE — Telephone Encounter (Signed)
Copied from [place] [id number]. Topic: Medicare AWV >> Feb 15, 2022 10:07 AM Jae Dire wrote: Reason for CRM:  Left message for patient to call back and schedule Medicare Annual Wellness Visit (AWV) in office.   If unable to come into the office for AWV,  please offer to do virtually or by telephone.  No hx of AWV eligible for AWVI per [employer] as of 12/08/2021  Please schedule at any time with [facility] -Nurse Health Advisor.      45 minute appointment   Any questions, please call me at [phone]

## 2022-03-12 ENCOUNTER — Telehealth: Payer: Self-pay | Admitting: Family Medicine

## 2022-03-12 NOTE — Telephone Encounter (Signed)
Copied from [place] [phone]. Topic: Medicare AWV >> Mar 12, 2022  1:14 PM Jae Dire wrote: Reason for CRM:  Left message for patient to call back and schedule Medicare Annual Wellness Visit (AWV) in office.   If unable to come into the office for AWV,  please offer to do virtually or by telephone.  No hx of AWV eligible for AWVI per [employer] as of 12/08/2021   Please schedule at any time with [facility] -Nurse Health Advisor.      45 minute appointment   Any questions, please call me at [phone]

## 2022-03-15 ENCOUNTER — Ambulatory Visit: Payer: Medicare Other | Admitting: Family Medicine

## 2022-03-15 ENCOUNTER — Telehealth: Payer: Self-pay | Admitting: Family Medicine

## 2022-03-15 NOTE — Telephone Encounter (Signed)
Left voice mail to call back to reschedule missed appointment on 03/15/22

## 2022-03-15 NOTE — Telephone Encounter (Signed)
-----   Message from Fredderick Severance sent at 03/15/2022  9:17 AM EST ----- Pt was scheduled for this morning- please call and get him scheduled for this week. Leave on schedule as a no show

## 2022-03-18 ENCOUNTER — Encounter: Payer: Self-pay | Admitting: Family Medicine

## 2022-04-17 ENCOUNTER — Other Ambulatory Visit: Payer: Self-pay | Admitting: Family Medicine

## 2022-04-17 DIAGNOSIS — I10 Essential (primary) hypertension: Secondary | ICD-10-CM

## 2022-04-17 DIAGNOSIS — N401 Enlarged prostate with lower urinary tract symptoms: Secondary | ICD-10-CM

## 2022-04-19 NOTE — Telephone Encounter (Signed)
Requested Prescriptions  Pending Prescriptions Disp Refills   amLODipine (NORVASC) 5 MG tablet [Pharmacy Med Name: AMLODIPINE BESYLATE 5 MG TAB] 30 tablet 0    Sig: TAKE 1 TABLET (5 MG TOTAL) BY MOUTH DAILY.     Cardiovascular: Calcium Channel Blockers 2 Failed - 04/17/2022  2:32 PM      Failed - Valid encounter within last 6 months    Recent Outpatient Visits           7 months ago Essential hypertension   [place] [facility]   7 months ago Other chest pain   [place] [facility]   7 months ago Annual physical exam   [facility]   9 months ago Essential hypertension   [place] [facility]   1 year ago Essential hypertension   [place] [facility]       Future Appointments             In 4 months Duanne Limerick, MD [facility] at [facility]   In 4 months Duanne Limerick, MD [facility] at [facility], [facility]            Passed - Last BP in normal range    BP Readings from Last 1 Encounters:  09/11/21 120/72         Passed - Last Heart Rate in normal range    Pulse Readings from Last 1 Encounters:  09/11/21 64          doxazosin (CARDURA) 1 MG tablet [Pharmacy Med Name: DOXAZOSIN MESYLATE 1 MG TAB] 30 tablet 0    Sig: TAKE 1 TABLET BY MOUTH EVERY DAY     Cardiovascular:  Alpha Blockers Failed - 04/17/2022  2:32 PM      Failed - Valid encounter within last 6 months    Recent Outpatient Visits           7 months ago Essential hypertension   [place] [facility]   7 months ago Other chest pain   [facility]   7 months ago Annual physical exam   [place] [facility]   9 months ago Essential hypertension   [place] [facility]   1 year ago Essential hypertension   [place] [facility]       Future Appointments             In 4 months Duanne Limerick, MD [facility] at [facility]   In 4 months Duanne Limerick, MD [facility] and Sports Medicine at [facility], [facility]            Passed - Last BP in normal range    BP Readings from Last 1 Encounters:  09/11/21 120/72

## 2022-05-07 DIAGNOSIS — H3589 Other specified retinal disorders: Secondary | ICD-10-CM | POA: Diagnosis not present

## 2022-05-07 DIAGNOSIS — H401133 Primary open-angle glaucoma, bilateral, severe stage: Secondary | ICD-10-CM | POA: Diagnosis not present

## 2022-05-13 ENCOUNTER — Other Ambulatory Visit: Payer: Self-pay | Admitting: Family Medicine

## 2022-05-13 DIAGNOSIS — N401 Enlarged prostate with lower urinary tract symptoms: Secondary | ICD-10-CM

## 2022-05-13 DIAGNOSIS — I10 Essential (primary) hypertension: Secondary | ICD-10-CM

## 2022-05-13 NOTE — Telephone Encounter (Signed)
Requested medication (s) are due for refill today: yes  Requested medication (s) are on the active medication list: yes  Last refill:  both 04/19/22 #30 refills  Future visit scheduled: yes  Notes to clinic:  appt is in April. Not sure if make sooner or was pt's request   Requested Prescriptions  Pending Prescriptions Disp Refills   doxazosin (CARDURA) 1 MG tablet [Pharmacy Med Name: DOXAZOSIN MESYLATE 1 MG TAB] 90 tablet 1    Sig: TAKE 1 TABLET BY MOUTH EVERY DAY     Cardiovascular:  Alpha Blockers Failed - 05/13/2022  9:33 AM      Failed - Valid encounter within last 6 months    Recent Outpatient Visits           8 months ago Essential hypertension   [place] [facility] at [place], Deanna C, MD   8 months ago Other chest pain   [place] [facility] at [place], Deanna C, MD   8 months ago Annual physical exam   [facility] and Sports Medicine at [place], Deanna C, MD   10 months ago Essential hypertension   [place] [facility] at [place], Deanna C, MD   1 year ago Essential hypertension   [place] [facility] at [place], [place], MD       Future Appointments             In 3 months Juline Patch, MD [facility] and [facility] Medicine at [facility], [place]   In 4 months Juline Patch, MD [facility] Primary Care and Sports Medicine at [facility], [place] BP in normal range    BP Readings from Last 1 Encounters:  09/11/21 120/72          amLODipine (NORVASC) 5 MG tablet [Pharmacy Med Name: AMLODIPINE BESYLATE 5 MG TAB] 90 tablet 1    Sig: TAKE 1 TABLET (5 MG TOTAL) BY MOUTH DAILY.     Cardiovascular: Calcium Channel Blockers 2 Failed - 05/13/2022  9:33 AM      Failed - Valid encounter within last 6 months    Recent Outpatient Visits            8 months ago Essential hypertension   [place] [facility], Deanna C, MD   8 months ago Other chest pain   [place] [facility] at [place], [place], MD   8 months ago Annual physical exam   [facility] and Sports Medicine at [place], Deanna C, MD   10 months ago Essential hypertension   [place] [facility] at [place], Deanna C, MD   1 year ago Essential hypertension   [place] [facility] at [place], [place], MD       Future Appointments             In 3 months Juline Patch, MD [facility] Primary Care and Sports Medicine at [facility], [place]   In 4 months Juline Patch, MD [place] and Sports Medicine at [facility], [facility]  Passed - Last BP in normal range    BP Readings from Last 1 Encounters:  09/11/21 120/72         Passed - Last Heart Rate in normal range    Pulse Readings from Last 1 Encounters:  09/11/21 64

## 2022-05-18 DIAGNOSIS — H47012 Ischemic optic neuropathy, left eye: Secondary | ICD-10-CM | POA: Diagnosis not present

## 2022-05-26 DIAGNOSIS — H401123 Primary open-angle glaucoma, left eye, severe stage: Secondary | ICD-10-CM | POA: Diagnosis not present

## 2022-05-27 ENCOUNTER — Ambulatory Visit (INDEPENDENT_AMBULATORY_CARE_PROVIDER_SITE_OTHER): Payer: Medicare Other | Admitting: Family Medicine

## 2022-05-27 ENCOUNTER — Encounter: Payer: Self-pay | Admitting: Family Medicine

## 2022-05-27 VITALS — BP 102/62 | HR 60 | Ht 67.0 in | Wt 194.0 lb

## 2022-05-27 DIAGNOSIS — J4520 Mild intermittent asthma, uncomplicated: Secondary | ICD-10-CM | POA: Diagnosis not present

## 2022-05-27 DIAGNOSIS — I10 Essential (primary) hypertension: Secondary | ICD-10-CM | POA: Diagnosis not present

## 2022-05-27 DIAGNOSIS — N401 Enlarged prostate with lower urinary tract symptoms: Secondary | ICD-10-CM | POA: Diagnosis not present

## 2022-05-27 DIAGNOSIS — R3911 Hesitancy of micturition: Secondary | ICD-10-CM

## 2022-05-27 DIAGNOSIS — R739 Hyperglycemia, unspecified: Secondary | ICD-10-CM | POA: Diagnosis not present

## 2022-05-27 MED ORDER — AMLODIPINE BESYLATE 5 MG PO TABS: 5.0000 mg | ORAL_TABLET | Freq: Every day | ORAL | 1 refills | Status: DC

## 2022-05-27 MED ORDER — DOXAZOSIN MESYLATE 1 MG PO TABS: 1.0000 mg | ORAL_TABLET | Freq: Every day | ORAL | 1 refills | Status: DC

## 2022-05-27 MED ORDER — VALSARTAN-HYDROCHLOROTHIAZIDE 160-25 MG PO TABS: 1.0000 | ORAL_TABLET | Freq: Every day | ORAL | 1 refills | Status: DC

## 2022-05-27 MED ORDER — ALBUTEROL SULFATE HFA 108 (90 BASE) MCG/ACT IN AERS: 2.0000 | INHALATION_SPRAY | Freq: Four times a day (QID) | RESPIRATORY_TRACT | 2 refills | Status: DC | PRN

## 2022-05-27 NOTE — Progress Notes (Signed)
Date:  05/27/2022   Name:  Jason [name]   DOB:  29-Mar-1956   MRN:  [id number]   Chief Complaint: Hypertension and Wheezing  Hypertension This is a chronic problem. The current episode started more than 1 year ago. The problem has been gradually improving since onset. The problem is controlled. Pertinent negatives include no anxiety, blurred vision, chest pain, headaches, malaise/fatigue, neck pain, orthopnea, palpitations, peripheral edema, PND, shortness of breath or sweats. There are no associated agents to hypertension. There are no known risk factors for coronary artery disease. Past treatments include angiotensin blockers, diuretics and calcium channel blockers. The current treatment provides moderate improvement. There are no compliance problems.  There is no history of angina, kidney disease, CAD/MI, CVA, heart failure, left ventricular hypertrophy, PVD or retinopathy. There is no history of chronic renal disease, a hypertension causing med or renovascular disease.  Wheezing  This is a recurrent problem. The current episode started in the past 7 days. The problem occurs intermittently. The problem has been gradually improving. Pertinent negatives include no chest pain, fever, headaches, neck pain, shortness of breath or sputum production. There is no history of heart failure.    Lab Results  Component Value Date   NA 140 07/16/2021   K 4.0 07/16/2021   CO2 27 07/16/2021   GLUCOSE 104 (H) 07/16/2021   BUN 14 07/16/2021   CREATININE 1.00 07/16/2021   CALCIUM 9.8 07/16/2021   EGFR 84 07/16/2021   GFRNONAA >60 07/21/2020   Lab Results  Component Value Date   CHOL 156 07/16/2021   HDL 49 07/16/2021   LDLCALC 97 07/16/2021   TRIG 49 07/16/2021   CHOLHDL 3.5 05/25/2017   Lab Results  Component Value Date   TSH 1.380 05/25/2017   Lab Results  Component Value Date   HGBA1C 4.6 (L) 05/25/2017   Lab Results  Component Value Date   WBC 4.8 09/01/2021   HGB 15.1 09/01/2021    HCT 44.6 09/01/2021   MCV 92 09/01/2021   PLT 209 09/01/2021   Lab Results  Component Value Date   ALT 24 05/25/2017   AST 22 05/25/2017   ALKPHOS 77 05/25/2017   BILITOT 0.5 05/25/2017   No results found for: "25OHVITD2", "25OHVITD3", "VD25OH"   Review of Systems  Constitutional:  Negative for diaphoresis, fatigue, fever, malaise/fatigue and unexpected weight change.  HENT:  Negative for sinus pressure and trouble swallowing.   Eyes:  Negative for blurred vision.  Respiratory:  Positive for wheezing. Negative for sputum production, choking, chest tightness, shortness of breath and stridor.   Cardiovascular:  Negative for chest pain, palpitations, orthopnea, leg swelling and PND.  Gastrointestinal:  Negative for abdominal distention.  Endocrine: Negative for polydipsia, polyphagia and polyuria.  Musculoskeletal:  Negative for neck pain.  Neurological:  Negative for headaches.    Patient Active Problem List   Diagnosis Date Noted   Colon cancer screening    Non-recurrent bilateral inguinal hernia without obstruction or gangrene    Glaucoma 07/18/2020   Age-related cataract of both eyes 07/18/2020   Essential hypertension 41/74/0814   Umbilical hernia without obstruction and without gangrene 05/25/2017   Exertional chest pain 05/25/2017   Cervical radiculopathy 05/25/2017   FH: diabetes mellitus 05/25/2017    No Active Allergies  Past Surgical History:  Procedure Laterality Date   COLONOSCOPY WITH PROPOFOL N/A 08/10/2021   Procedure: COLONOSCOPY WITH PROPOFOL;  Surgeon: Lucilla Lame, MD;  Location: [place];  Service: Endoscopy;  Laterality: N/A;  SHOULDER ARTHROSCOPY W/ ROTATOR CUFF REPAIR Left 2014   TRABECULECTOMY Left    UMBILICAL HERNIA REPAIR N/A 07/24/2020   Procedure: HERNIA REPAIR UMBILICAL ADULT, open;  Surgeon: Henrene Dodge, MD;  Location: [facility];  Service: General;  Laterality: N/A;   UPPER GI ENDOSCOPY     XI ROBOTIC ASSISTED INGUINAL HERNIA  REPAIR WITH MESH Left 07/24/2020   Procedure: XI ROBOTIC ASSISTED INGUINAL HERNIA REPAIR WITH MESH;  Surgeon: Henrene Dodge, MD;  Location: [facility];  Service: General;  Laterality: Left;    Social History   Tobacco Use   Smoking status: Never   Smokeless tobacco: Never  Vaping Use   Vaping Use: Never used  Substance Use Topics   Alcohol use: Yes    Alcohol/week: 10.0 standard drinks of alcohol    Types: 10 Cans of beer per week   Drug use: Yes    Types: Marijuana    Comment: Occasionally     Medication list has been reviewed and updated.  Current Meds  Medication Sig   acetaminophen (TYLENOL) 500 MG tablet Take 2 tablets (1,000 mg total) by mouth every 6 (six) hours as needed for mild pain.   amLODipine (NORVASC) 5 MG tablet TAKE 1 TABLET (5 MG TOTAL) BY MOUTH DAILY.   Ascorbic Acid (VITAMIN C) 1000 MG tablet Take 1,000 mg by mouth daily.   cholecalciferol (VITAMIN D3) 25 MCG (1000 UNIT) tablet Take 1,000 Units by mouth daily.   Cod Liver Oil 1000 MG CAPS Take 1,000 mg by mouth daily.   dorzolamide-timolol (COSOPT) 2-0.5 % ophthalmic solution Administer 1 drop to both eyes two (2) times a day.   doxazosin (CARDURA) 1 MG tablet TAKE 1 TABLET BY MOUTH EVERY DAY   ibuprofen (ADVIL) 600 MG tablet Take 1 tablet (600 mg total) by mouth every 8 (eight) hours as needed for moderate pain.   latanoprost (XALATAN) 0.005 % ophthalmic solution 1 drop at bedtime.   Misc Natural Products (HORNY GOAT WEED PO) Take by mouth.   Multiple Vitamins-Minerals (MULTIVITAMIN MEN PO) Take by mouth. Mega Man   valsartan-hydrochlorothiazide (DIOVAN-HCT) 160-25 MG tablet Take 1 tablet by mouth daily.       05/27/2022    9:55 AM 09/11/2021   10:59 AM 09/07/2021   10:20 AM 09/01/2021   10:11 AM  GAD 7 : Generalized Anxiety Score  Nervous, Anxious, on Edge 0 0 0 0  Control/stop worrying 0 0 0 0  Worry too much - different things 0 0 0 0  Trouble relaxing 0 0 0 0  Restless 0 0 0 0  Easily annoyed or  irritable 0 0 0 0  Afraid - awful might happen 0 0 0 0  Total GAD 7 Score 0 0 0 0  Anxiety Difficulty Not difficult at all Not difficult at all Not difficult at all Not difficult at all       05/27/2022    9:54 AM 09/11/2021   10:59 AM 09/07/2021   10:20 AM  Depression screen PHQ 2/9  Decreased Interest 0 0 0  Down, Depressed, Hopeless 0 0 0  PHQ - 2 Score 0 0 0  Altered sleeping 0 0 0  Tired, decreased energy 0 0 0  Change in appetite 0 0 0  Feeling bad or failure about yourself  0 0 0  Trouble concentrating 0 0 0  Moving slowly or fidgety/restless 0 0 0  Suicidal thoughts 0 0 0  PHQ-9 Score 0 0 0  Difficult doing work/chores Not  difficult at all Not difficult at all Not difficult at all    BP Readings from Last 3 Encounters:  05/27/22 102/62  09/11/21 120/72  09/07/21 (!) 146/94    Physical Exam Vitals and nursing note reviewed.  HENT:     Head: Normocephalic.     Right Ear: Tympanic membrane, ear canal and external ear normal. There is no impacted cerumen.     Left Ear: Tympanic membrane, ear canal and external ear normal. There is no impacted cerumen.     Nose: Nose normal. No congestion or rhinorrhea.     Mouth/Throat:     Mouth: Mucous membranes are moist.     Pharynx: No oropharyngeal exudate or posterior oropharyngeal erythema.  Eyes:     General: No scleral icterus.       Right eye: No discharge.        Left eye: No discharge.     Conjunctiva/sclera: Conjunctivae normal.     Pupils: Pupils are equal, round, and reactive to light.  Neck:     Thyroid: No thyromegaly.     Vascular: No JVD.     Trachea: No tracheal deviation.  Cardiovascular:     Rate and Rhythm: Normal rate and regular rhythm.     Heart sounds: Normal heart sounds. No murmur heard.    No friction rub. No gallop.  Pulmonary:     Effort: No respiratory distress.     Breath sounds: Normal breath sounds. No wheezing, rhonchi or rales.  Abdominal:     General: Bowel sounds are normal.      Palpations: Abdomen is soft. There is no mass.     Tenderness: There is no abdominal tenderness. There is no guarding or rebound.  Musculoskeletal:        General: No tenderness. Normal range of motion.     Cervical back: Normal range of motion and neck supple.  Lymphadenopathy:     Cervical: No cervical adenopathy.  Skin:    General: Skin is warm.     Findings: No rash.  Neurological:     Mental Status: He is alert and oriented to person, place, and time.     Cranial Nerves: No cranial nerve deficit.     Gait: Gait normal.     Deep Tendon Reflexes: Reflexes are normal and symmetric. Reflexes normal.     Wt Readings from Last 3 Encounters:  05/27/22 194 lb (88 kg)  09/11/21 186 lb (84.4 kg)  09/07/21 182 lb (82.6 kg)    BP 102/62   Pulse 60   Ht 5\' 7"  (1.702 m)   Wt 194 lb (88 kg)   SpO2 98%   BMI 30.38 kg/m   Assessment and Plan:  1. Essential hypertension Chronic.  Controlled.  Stable.  Blood pressure 102/62.  Continue amlodipine 5 mg once a day, Cardura 1 mg daily, and valsartan hydrochlorothiazide 160-25 mg once a day.  Will check comprehensive metabolic panel for electrolytes and GFR. - amLODipine (NORVASC) 5 MG tablet; Take 1 tablet (5 mg total) by mouth daily.  Dispense: 90 tablet; Refill: 1 - doxazosin (CARDURA) 1 MG tablet; Take 1 tablet (1 mg total) by mouth daily.  Dispense: 90 tablet; Refill: 1 - valsartan-hydrochlorothiazide (DIOVAN-HCT) 160-25 MG tablet; Take 1 tablet by mouth daily.  Dispense: 90 tablet; Refill: 1  2. Benign prostatic hyperplasia with urinary hesitancy Chronic.  Controlled.  Stable.  Continue Cardura 1 mg daily. - doxazosin (CARDURA) 1 MG tablet; Take 1 tablet (1 mg total) by  mouth daily.  Dispense: 90 tablet; Refill: 1  3. Hyperglycemia Elevated cholesterol and glucose in the past we will proceed with an A1c to make sure that were not in prediabetic range. - Lipid Panel With LDL/HDL Ratio - Comprehensive Metabolic Panel (CMET) -  Hemoglobin A1c  4. Mild intermittent reactive airway disease without complication  Mild.  Intermittent.  Patient had asthma as a child and has had some reexacerbation of wheezing.  Would like to have an albuterol available for circumstances that he is having in flare.  Will refill albuterol inhaler - albuterol (VENTOLIN HFA) 108 (90 Base) MCG/ACT inhaler; Inhale 2 puffs into the lungs every 6 (six) hours as needed for wheezing or shortness of breath.  Dispense: 18 g; Refill: 2    Otilio Miu, MD

## 2022-05-28 LAB — HEMOGLOBIN A1C
Est. average glucose Bld gHb Est-mCnc: 91 mg/dL
Hgb A1c MFr Bld: 4.8 % (ref 4.8–5.6)

## 2022-05-28 LAB — COMPREHENSIVE METABOLIC PANEL
ALT: 34 IU/L (ref 0–44)
AST: 21 IU/L (ref 0–40)
Albumin/Globulin Ratio: 1.8 (ref 1.2–2.2)
Albumin: 4.5 g/dL (ref 3.9–4.9)
Alkaline Phosphatase: 73 IU/L (ref 44–121)
BUN/Creatinine Ratio: 14 (ref 10–24)
BUN: 13 mg/dL (ref 8–27)
Bilirubin Total: 0.6 mg/dL (ref 0.0–1.2)
CO2: 26 mmol/L (ref 20–29)
Calcium: 8.9 mg/dL (ref 8.6–10.2)
Chloride: 98 mmol/L (ref 96–106)
Creatinine, Ser: 0.95 mg/dL (ref 0.76–1.27)
Globulin, Total: 2.5 g/dL (ref 1.5–4.5)
Glucose: 123 mg/dL — ABNORMAL HIGH (ref 70–99)
Potassium: 3.8 mmol/L (ref 3.5–5.2)
Sodium: 137 mmol/L (ref 134–144)
Total Protein: 7 g/dL (ref 6.0–8.5)
eGFR: 88 mL/min/{1.73_m2} (ref >59)

## 2022-05-28 LAB — LIPID PANEL WITH LDL/HDL RATIO
Cholesterol, Total: 145 mg/dL (ref 100–199)
HDL: 38 mg/dL — ABNORMAL LOW (ref >39)
LDL Chol Calc (NIH): 93 mg/dL (ref 0–99)
LDL/HDL Ratio: 2.4 ratio (ref 0.0–3.6)
Triglycerides: 73 mg/dL (ref 0–149)
VLDL Cholesterol Cal: 14 mg/dL (ref 5–40)

## 2022-07-06 ENCOUNTER — Ambulatory Visit (INDEPENDENT_AMBULATORY_CARE_PROVIDER_SITE_OTHER): Payer: Medicare Other | Admitting: Family Medicine

## 2022-07-06 ENCOUNTER — Encounter: Payer: Self-pay | Admitting: Family Medicine

## 2022-07-06 VITALS — BP 120/76 | HR 62 | Ht 67.0 in | Wt 190.0 lb

## 2022-07-06 DIAGNOSIS — I10 Essential (primary) hypertension: Secondary | ICD-10-CM

## 2022-07-06 NOTE — Progress Notes (Signed)
Date:  07/06/2022   Name:  Jason [name]   DOB:  1955/06/20   MRN:  [id number]   Chief Complaint: Hypertension  Hypertension This is a chronic problem. The current episode started more than 1 year ago. The problem has been gradually improving since onset. The problem is controlled. Pertinent negatives include no chest pain, headaches, neck pain, orthopnea, palpitations, PND or shortness of breath. There are no associated agents to hypertension. Risk factors for coronary artery disease include dyslipidemia. Past treatments include angiotensin blockers, diuretics and calcium channel blockers. The current treatment provides moderate improvement. There are no compliance problems.  There is no history of angina, kidney disease, CAD/MI, CVA, heart failure, left ventricular hypertrophy, PVD or retinopathy. There is no history of chronic renal disease, a hypertension causing med or renovascular disease.    Lab Results  Component Value Date   NA 137 05/27/2022   K 3.8 05/27/2022   CO2 26 05/27/2022   GLUCOSE 123 (H) 05/27/2022   BUN 13 05/27/2022   CREATININE 0.95 05/27/2022   CALCIUM 8.9 05/27/2022   EGFR 88 05/27/2022   GFRNONAA >60 07/21/2020   Lab Results  Component Value Date   CHOL 145 05/27/2022   HDL 38 (L) 05/27/2022   LDLCALC 93 05/27/2022   TRIG 73 05/27/2022   CHOLHDL 3.5 05/25/2017   Lab Results  Component Value Date   TSH 1.380 05/25/2017   Lab Results  Component Value Date   HGBA1C 4.8 05/27/2022   Lab Results  Component Value Date   WBC 4.8 09/01/2021   HGB 15.1 09/01/2021   HCT 44.6 09/01/2021   MCV 92 09/01/2021   PLT 209 09/01/2021   Lab Results  Component Value Date   ALT 34 05/27/2022   AST 21 05/27/2022   ALKPHOS 73 05/27/2022   BILITOT 0.6 05/27/2022   No results found for: "25OHVITD2", "25OHVITD3", "VD25OH"   Review of Systems  Constitutional:  Negative for chills and fever.  HENT:  Negative for drooling, ear discharge, ear pain and sore  throat.   Respiratory:  Negative for cough, shortness of breath and wheezing.   Cardiovascular:  Negative for chest pain, palpitations, orthopnea, leg swelling and PND.  Gastrointestinal:  Negative for abdominal pain, blood in stool, constipation, diarrhea and nausea.  Endocrine: Negative for polydipsia.  Genitourinary:  Negative for dysuria, frequency, hematuria and urgency.  Musculoskeletal:  Negative for back pain, myalgias and neck pain.  Skin:  Negative for rash.  Allergic/Immunologic: Negative for environmental allergies.  Neurological:  Negative for dizziness and headaches.  Hematological:  Does not bruise/bleed easily.  Psychiatric/Behavioral:  Negative for suicidal ideas. The patient is not nervous/anxious.     Patient Active Problem List   Diagnosis Date Noted   Colon cancer screening    Non-recurrent bilateral inguinal hernia without obstruction or gangrene    Glaucoma 07/18/2020   Age-related cataract of both eyes 07/18/2020   Essential hypertension 99991111   Umbilical hernia without obstruction and without gangrene 05/25/2017   Exertional chest pain 05/25/2017   Cervical radiculopathy 05/25/2017   FH: diabetes mellitus 05/25/2017    No Active Allergies  Past Surgical History:  Procedure Laterality Date   COLONOSCOPY WITH PROPOFOL N/A 08/10/2021   Procedure: COLONOSCOPY WITH PROPOFOL;  Surgeon: Lucilla Lame, MD;  Location: [facility];  Service: Endoscopy;  Laterality: N/A;   SHOULDER ARTHROSCOPY W/ ROTATOR CUFF REPAIR Left 2014   TRABECULECTOMY Left    UMBILICAL HERNIA REPAIR N/A 07/24/2020   Procedure: HERNIA  REPAIR UMBILICAL ADULT, open;  Surgeon: Olean Ree, MD;  Location: [facility];  Service: General;  Laterality: N/A;   UPPER GI ENDOSCOPY     XI ROBOTIC ASSISTED INGUINAL HERNIA REPAIR WITH MESH Left 07/24/2020   Procedure: XI ROBOTIC ASSISTED INGUINAL HERNIA REPAIR WITH MESH;  Surgeon: Olean Ree, MD;  Location: [facility];  Service: General;   Laterality: Left;    Social History   Tobacco Use   Smoking status: Never   Smokeless tobacco: Never  Vaping Use   Vaping Use: Never used  Substance Use Topics   Alcohol use: Yes    Alcohol/week: 10.0 standard drinks of alcohol    Types: 10 Cans of beer per week   Drug use: Yes    Types: Marijuana    Comment: Occasionally     Medication list has been reviewed and updated.  Current Meds  Medication Sig   acetaminophen (TYLENOL) 500 MG tablet Take 2 tablets (1,000 mg total) by mouth every 6 (six) hours as needed for mild pain.   albuterol (VENTOLIN HFA) 108 (90 Base) MCG/ACT inhaler Inhale 2 puffs into the lungs every 6 (six) hours as needed for wheezing or shortness of breath.   amLODipine (NORVASC) 5 MG tablet Take 1 tablet (5 mg total) by mouth daily.   Ascorbic Acid (VITAMIN C) 1000 MG tablet Take 1,000 mg by mouth daily.   cholecalciferol (VITAMIN D3) 25 MCG (1000 UNIT) tablet Take 1,000 Units by mouth daily.   Cod Liver Oil 1000 MG CAPS Take 1,000 mg by mouth daily.   dorzolamide-timolol (COSOPT) 2-0.5 % ophthalmic solution Administer 1 drop to both eyes two (2) times a day.   doxazosin (CARDURA) 1 MG tablet Take 1 tablet (1 mg total) by mouth daily.   ibuprofen (ADVIL) 600 MG tablet Take 1 tablet (600 mg total) by mouth every 8 (eight) hours as needed for moderate pain.   latanoprost (XALATAN) 0.005 % ophthalmic solution 1 drop at bedtime.   Misc Natural Products (HORNY GOAT WEED PO) Take by mouth.   Multiple Vitamins-Minerals (MULTIVITAMIN MEN PO) Take by mouth. Mega Man   valsartan-hydrochlorothiazide (DIOVAN-HCT) 160-25 MG tablet Take 1 tablet by mouth daily.       07/06/2022   10:37 AM 05/27/2022    9:55 AM 09/11/2021   10:59 AM 09/07/2021   10:20 AM  GAD 7 : Generalized Anxiety Score  Nervous, Anxious, on Edge 0 0 0 0  Control/stop worrying 0 0 0 0  Worry too much - different things 0 0 0 0  Trouble relaxing 0 0 0 0  Restless 0 0 0 0  Easily annoyed or irritable  0 0 0 0  Afraid - awful might happen 0 0 0 0  Total GAD 7 Score 0 0 0 0  Anxiety Difficulty Not difficult at all Not difficult at all Not difficult at all Not difficult at all       07/06/2022   10:37 AM 05/27/2022    9:54 AM 09/11/2021   10:59 AM  Depression screen PHQ 2/9  Decreased Interest 0 0 0  Down, Depressed, Hopeless 0 0 0  PHQ - 2 Score 0 0 0  Altered sleeping 0 0 0  Tired, decreased energy 0 0 0  Change in appetite 0 0 0  Feeling bad or failure about yourself  0 0 0  Trouble concentrating 0 0 0  Moving slowly or fidgety/restless 0 0 0  Suicidal thoughts 0 0 0  PHQ-9 Score 0 0  0  Difficult doing work/chores Not difficult at all Not difficult at all Not difficult at all    BP Readings from Last 3 Encounters:  07/06/22 120/76  05/27/22 102/62  09/11/21 120/72    Physical Exam Vitals and nursing note reviewed.  HENT:     Head: Normocephalic.     Right Ear: Tympanic membrane, ear canal and external ear normal.     Left Ear: Tympanic membrane, ear canal and external ear normal.     Nose: Nose normal.     Mouth/Throat:     Mouth: Mucous membranes are moist.  Eyes:     General: No scleral icterus.       Right eye: No discharge.        Left eye: No discharge.     Conjunctiva/sclera: Conjunctivae normal.     Pupils: Pupils are equal, round, and reactive to light.  Neck:     Thyroid: No thyromegaly.     Vascular: No JVD.     Trachea: No tracheal deviation.  Cardiovascular:     Rate and Rhythm: Normal rate and regular rhythm.     Heart sounds: Normal heart sounds. No murmur heard.    No friction rub. No gallop.  Pulmonary:     Effort: No respiratory distress.     Breath sounds: Normal breath sounds. No wheezing, rhonchi or rales.  Abdominal:     General: Bowel sounds are normal.     Palpations: Abdomen is soft. There is no mass.     Tenderness: There is no abdominal tenderness. There is no guarding or rebound.  Musculoskeletal:        General: No tenderness.  Normal range of motion.     Cervical back: Normal range of motion and neck supple.  Lymphadenopathy:     Cervical: No cervical adenopathy.  Skin:    General: Skin is warm.     Findings: No rash.  Neurological:     Mental Status: He is alert and oriented to person, place, and time.     Cranial Nerves: No cranial nerve deficit.     Deep Tendon Reflexes: Reflexes are normal and symmetric.     Wt Readings from Last 3 Encounters:  07/06/22 190 lb (86.2 kg)  05/27/22 194 lb (88 kg)  09/11/21 186 lb (84.4 kg)    BP 120/76   Pulse 62   Ht '5\' 7"'$  (1.702 m)   Wt 190 lb (86.2 kg)   SpO2 95%   BMI 29.76 kg/m   Assessment and Plan:  1. Essential hypertension Chronic.  Controlled.  Stable.  Blood pressure today is 120/76.  Asymptomatic.  Tolerating medications well.  Patient is currently taking valsartan hydrochlorothiazide 1 60-25 once a day and amlodipine 5 mg once a day.  Patient will be leaving for [place] and he will call prior and we will make sure he has sufficient amount of medication at least 4 months to last until return to the states.  Patient is also been encouraged to monitor his sodium intake.   Otilio Miu, MD

## 2022-07-07 ENCOUNTER — Ambulatory Visit: Payer: Medicare Other | Admitting: Family Medicine

## 2022-07-21 ENCOUNTER — Telehealth: Payer: Self-pay

## 2022-07-21 DIAGNOSIS — H469 Unspecified optic neuritis: Secondary | ICD-10-CM | POA: Diagnosis not present

## 2022-07-21 DIAGNOSIS — H547 Unspecified visual loss: Secondary | ICD-10-CM | POA: Diagnosis not present

## 2022-07-21 DIAGNOSIS — G629 Polyneuropathy, unspecified: Secondary | ICD-10-CM | POA: Diagnosis not present

## 2022-07-21 DIAGNOSIS — G319 Degenerative disease of nervous system, unspecified: Secondary | ICD-10-CM | POA: Diagnosis not present

## 2022-07-21 DIAGNOSIS — H4720 Unspecified optic atrophy: Secondary | ICD-10-CM | POA: Diagnosis not present

## 2022-07-21 NOTE — Telephone Encounter (Signed)
Called pt left VM to call back to schedule AWV. Pt needs initial AWV.  KP

## 2022-08-24 DIAGNOSIS — H47012 Ischemic optic neuropathy, left eye: Secondary | ICD-10-CM | POA: Diagnosis not present

## 2022-08-24 DIAGNOSIS — H401133 Primary open-angle glaucoma, bilateral, severe stage: Secondary | ICD-10-CM | POA: Diagnosis not present

## 2022-09-06 ENCOUNTER — Ambulatory Visit (INDEPENDENT_AMBULATORY_CARE_PROVIDER_SITE_OTHER): Payer: Medicare Other

## 2022-09-06 ENCOUNTER — Ambulatory Visit (INDEPENDENT_AMBULATORY_CARE_PROVIDER_SITE_OTHER): Payer: Medicare Other | Admitting: Family Medicine

## 2022-09-06 ENCOUNTER — Encounter: Payer: Self-pay | Admitting: Family Medicine

## 2022-09-06 VITALS — BP 122/78 | HR 84 | Ht 67.0 in | Wt 190.0 lb

## 2022-09-06 DIAGNOSIS — R0789 Other chest pain: Secondary | ICD-10-CM | POA: Diagnosis not present

## 2022-09-06 DIAGNOSIS — I10 Essential (primary) hypertension: Secondary | ICD-10-CM | POA: Diagnosis not present

## 2022-09-06 DIAGNOSIS — Z0000 Encounter for general adult medical examination without abnormal findings: Secondary | ICD-10-CM

## 2022-09-06 DIAGNOSIS — R0609 Other forms of dyspnea: Secondary | ICD-10-CM | POA: Diagnosis not present

## 2022-09-06 NOTE — Progress Notes (Signed)
Subjective:   Jason [name] is a 67 y.o. male who presents for an Initial Medicare Annual Wellness Visit.  I connected with  Jason [name] on 09/06/22 by an in person visit and verified that I am speaking with the correct person using two identifiers.  Patient Location: Other:  In Office  Provider Location: Office/Clinic  I discussed the limitations of evaluation and management by telemedicine. The patient expressed understanding and agreed to proceed.   Review of Systems    Defer to PCP  Cardiac Risk Factors include: advanced age (>35men, >37 women)     Objective:    There were no vitals filed for this visit. There is no height or weight on file to calculate BMI.     09/06/2022   10:19 AM 08/10/2021    7:49 AM 07/24/2020    7:13 AM 07/17/2020    1:03 PM  Advanced Directives  Does Patient Have a Medical Advance Directive? No No No No  Does patient want to make changes to medical advance directive?   No - Patient declined   Would patient like information on creating a medical advance directive? No - Patient declined No - Patient declined No - Patient declined Yes (MAU/Ambulatory/Procedural Areas - Information given)    Current Medications (verified) Outpatient Encounter Medications as of 09/06/2022  Medication Sig   acetaminophen (TYLENOL) 500 MG tablet Take 2 tablets (1,000 mg total) by mouth every 6 (six) hours as needed for mild pain.   albuterol (VENTOLIN HFA) 108 (90 Base) MCG/ACT inhaler Inhale 2 puffs into the lungs every 6 (six) hours as needed for wheezing or shortness of breath.   amLODipine (NORVASC) 5 MG tablet Take 1 tablet (5 mg total) by mouth daily.   Ascorbic Acid (VITAMIN C) 1000 MG tablet Take 1,000 mg by mouth daily.   cholecalciferol (VITAMIN D3) 25 MCG (1000 UNIT) tablet Take 1,000 Units by mouth daily.   Cod Liver Oil 1000 MG CAPS Take 1,000 mg by mouth daily.   dorzolamide-timolol (COSOPT) 2-0.5 % ophthalmic solution Administer 1 drop to both eyes two (2)  times a day.   doxazosin (CARDURA) 1 MG tablet Take 1 tablet (1 mg total) by mouth daily.   ibuprofen (ADVIL) 600 MG tablet Take 1 tablet (600 mg total) by mouth every 8 (eight) hours as needed for moderate pain.   Misc Natural Products (HORNY GOAT WEED PO) Take by mouth.   Multiple Vitamins-Minerals (MULTIVITAMIN MEN PO) Take by mouth. Mega Man   valsartan-hydrochlorothiazide (DIOVAN-HCT) 160-25 MG tablet Take 1 tablet by mouth daily.   No facility-administered encounter medications on file as of 09/06/2022.    Allergies (verified) Patient has no active allergies.   History: Past Medical History:  Diagnosis Date   Asthma    as a child    Hypertension    Wears dentures    partial upper and lower   Past Surgical History:  Procedure Laterality Date   COLONOSCOPY WITH PROPOFOL N/A 08/10/2021   Procedure: COLONOSCOPY WITH PROPOFOL;  Surgeon: Midge Minium, MD;  [facility];  Service: Endoscopy;  Laterality: N/A;   SHOULDER ARTHROSCOPY W/ ROTATOR CUFF REPAIR Left 2014   TRABECULECTOMY Left    UMBILICAL HERNIA REPAIR N/A 07/24/2020   Procedure: HERNIA REPAIR UMBILICAL ADULT, open;  Surgeon: Henrene Dodge, MD;  Location: [facility];  Service: General;  Laterality: N/A;   UPPER GI ENDOSCOPY     XI ROBOTIC ASSISTED INGUINAL HERNIA REPAIR WITH MESH Left 07/24/2020   Procedure: XI  ROBOTIC ASSISTED INGUINAL HERNIA REPAIR WITH MESH;  Surgeon: Henrene Dodge, MD;  Location: [facility];  Service: General;  Laterality: Left;   Family History  Problem Relation Age of Onset   Diabetes Mother    Prostatitis Father    Prostatitis Paternal Aunt    Social History   Socioeconomic History   Marital status: Widowed    Spouse name: Not on file   Number of children: Not on file   Years of education: Not on file   Highest education level: Not on file  Occupational History   Occupation: employed  Tobacco Use   Smoking status: Never   Smokeless tobacco: Never  Vaping Use   Vaping  Use: Never used  Substance and Sexual Activity   Alcohol use: Yes    Alcohol/week: 10.0 standard drinks of alcohol    Types: 10 Cans of beer per week   Drug use: Yes    Types: Marijuana    Comment: Occasionally   Sexual activity: Not on file  Other Topics Concern   Not on file  Social History Narrative   Not on file   Social Determinants of Health   Financial Resource Strain: Low Risk  (09/06/2022)   Overall Financial Resource Strain (CARDIA)    Difficulty of Paying Living Expenses: Not hard at all  Food Insecurity: No Food Insecurity (09/06/2022)   Hunger Vital Sign    Worried About Running Out of Food in the Last Year: Never true    Ran Out of Food in the Last Year: Never true  Transportation Needs: No Transportation Needs (09/06/2022)   PRAPARE - Administrator, Civil Service (Medical): No    Lack of Transportation (Non-Medical): No  Physical Activity: Sufficiently Active (09/06/2022)   Exercise Vital Sign    Days of Exercise per Week: 7 days    Minutes of Exercise per Session: 60 min  Stress: No Stress Concern Present (09/06/2022)   [employer] of Occupational Health - Occupational Stress Questionnaire    Feeling of Stress : Not at all  Social Connections: Unknown (10/24/2018)   Social Connection and Isolation Panel [NHANES]    Frequency of Communication with Friends and Family: Patient declined    Frequency of Social Gatherings with Friends and Family: Patient declined    Attends Religious Services: Patient declined    Database administrator or Organizations: Patient declined    Attends Engineer, structural: Patient declined    Marital Status: Patient declined    Tobacco Counseling Counseling given: Not Answered   Clinical Intake:  Pre-visit preparation completed: Yes  Pain : No/denies pain     BMI - recorded: 29.76 Nutritional Status: BMI > 30  Obese Nutritional Risks: None Diabetes: No  How often do you need to have someone help  you when you read instructions, pamphlets, or other written materials from your doctor or pharmacy?: 1 - Never  Diabetic? Yes.  Interpreter Needed?: No  Information entered by :: Margaretha Sheffield, CMA   Activities of Daily Living    09/06/2022   10:19 AM  In your present state of health, do you have any difficulty performing the following activities:  Hearing? 0  Vision? 0  Difficulty concentrating or making decisions? 0  Walking or climbing stairs? 0  Dressing or bathing? 0  Doing errands, shopping? 0  Preparing Food and eating ? N  Using the Toilet? N  In the past six months, have you accidently leaked urine? N  Do you  have problems with loss of bowel control? N  Managing your Medications? N  Managing your Finances? N  Housekeeping or managing your Housekeeping? N    Patient Care Team: Duanne Limerick, MD as PCP - General (Family Medicine)  Indicate any recent Medical Services you may have received from other than [facility] providers in the past year (date may be approximate).     Assessment:   This is a routine wellness examination for [place].  Hearing/Vision screen Hearing Screening - Comments:: No concerns. Vision Screening - Comments:: No concerns.  Dietary issues and exercise activities discussed: Current Exercise Habits: Home exercise routine, Type of exercise: walking, Time (Minutes): 60, Frequency (Times/Week): 7, Weekly Exercise (Minutes/Week): 420, Intensity: Moderate   Goals Addressed   None   Depression Screen    09/06/2022    9:50 AM 07/06/2022   10:37 AM 05/27/2022    9:54 AM 09/11/2021   10:59 AM 09/07/2021   10:20 AM 09/01/2021   10:10 AM 07/16/2021    8:54 AM  PHQ 2/9 Scores  PHQ - 2 Score 0 0 0 0 0 0 0  PHQ- 9 Score 0 0 0 0 0 0 0    Fall Risk    09/06/2022    9:50 AM 07/06/2022   10:36 AM 05/27/2022    9:54 AM 09/01/2021   10:10 AM 07/21/2020   10:48 AM  Fall Risk   Falls in the past year? 0 0 0 0 0  Number falls in past yr: 0 0 0 0 0  Injury with  Fall? 0 0 0 0 0  Risk for fall due to : No Fall Risks No Fall Risks No Fall Risks No Fall Risks   Follow up Falls evaluation completed Falls evaluation completed Falls evaluation completed Falls evaluation completed Falls evaluation completed    FALL RISK PREVENTION PERTAINING TO THE HOME:  Any stairs in or around the home? Yes  If so, are there any without handrails? No  Home free of loose throw rugs in walkways, pet beds, electrical cords, etc? Yes  Adequate lighting in your home to reduce risk of falls? Yes   ASSISTIVE DEVICES UTILIZED TO PREVENT FALLS:  Life alert? No  Use of a cane, walker or w/c? No   TIMED UP AND GO:  Was the test performed? Yes .  Gait steady and fast without use of assistive device  Cognitive Function:        Immunizations Immunization History  Administered Date(s) Administered   Janssen (J&J) SARS-COV-2 Vaccination 03/18/2020   Tdap 04/16/2014    TDAP status: Up to date  Flu Vaccine status: Declined, Education has been provided regarding the importance of this vaccine but patient still declined. Advised may receive this vaccine at local pharmacy or Health Dept. Aware to provide a copy of the vaccination record if obtained from local pharmacy or Health Dept. Verbalized acceptance and understanding.  Pneumococcal vaccine status: Declined,  Education has been provided regarding the importance of this vaccine but patient still declined. Advised may receive this vaccine at local pharmacy or Health Dept. Aware to provide a copy of the vaccination record if obtained from local pharmacy or Health Dept. Verbalized acceptance and understanding.   Covid-19 vaccine status: Declined, Education has been provided regarding the importance of this vaccine but patient still declined. Advised may receive this vaccine at local pharmacy or Health Dept.or vaccine clinic. Aware to provide a copy of the vaccination record if obtained from local pharmacy or Health Dept.  Verbalized acceptance and understanding.  Qualifies for Shingles Vaccine? Yes   Zostavax completed No   Shingrix Completed?: No.    Education has been provided regarding the importance of this vaccine. Patient has been advised to call insurance company to determine out of pocket expense if they have not yet received this vaccine. Advised may also receive vaccine at local pharmacy or Health Dept. Verbalized acceptance and understanding.  Screening Tests Health Maintenance  Topic Date Due   COVID-19 Vaccine (2 - 2023-24 season) 09/22/2022 (Originally 01/08/2022)   Zoster Vaccines- Shingrix (1 of 2) 12/06/2022 (Originally 07/26/2005)   Pneumonia Vaccine 42+ Years old (1 of 1 - PCV) 09/06/2023 (Originally 07/26/2020)   INFLUENZA VACCINE  12/09/2022   Medicare Annual Wellness (AWV)  09/06/2023   DTaP/Tdap/Td (2 - Td or Tdap) 04/16/2024   COLONOSCOPY (Pts 45-10yrs Insurance coverage will need to be confirmed)  08/11/2031   Hepatitis C Screening  Completed   HPV VACCINES  Aged Out    Health Maintenance  There are no preventive care reminders to display for this patient.   Colorectal cancer screening: Type of screening: Colonoscopy. Completed 08/10/2021. Repeat every 10 years  Lung Cancer Screening: (Low Dose CT Chest recommended if Age 56-80 years, 30 pack-year currently smoking OR have quit w/in 15years.) does not qualify.   Additional Screening:  Hepatitis C Screening: does qualify; Completed 10/24/2018  Vision Screening: Recommended annual ophthalmology exams for early detection of glaucoma and other disorders of the eye. Is the patient up to date with their annual eye exam?  Yes  Who is the provider or what is the name of the office in which the patient attends annual eye exams? [facility]  Dental Screening: Recommended annual dental exams for proper oral hygiene  Community Resource Referral / Chronic Care Management: CRR required this visit?  No   CCM required this visit?   No      Plan:     I have personally reviewed and noted the following in the patient's chart:   Medical and social history Use of alcohol, tobacco or illicit drugs  Current medications and supplements including opioid prescriptions. Patient is not currently taking opioid prescriptions. Functional ability and status Nutritional status Physical activity Advanced directives List of other physicians Hospitalizations, surgeries, and ER visits in previous 12 months Vitals Screenings to include cognitive, depression, and falls Referrals and appointments  In addition, I have reviewed and discussed with patient certain preventive protocols, quality metrics, and best practice recommendations. A written personalized care plan for preventive services as well as general preventive health recommendations were provided to patient.     Mariel Sleet, CMA   09/06/2022   Nurse Notes: None.

## 2022-09-06 NOTE — Progress Notes (Signed)
Date:  09/06/2022   Name:  Jason [name]   DOB:  07-10-1955   MRN:  [id number]   Chief Complaint: Annual Exam and Hypertension  Hypertension This is a chronic problem. The current episode started more than 1 year ago. The problem has been gradually improving since onset. Associated symptoms include chest pain and shortness of breath. Pertinent negatives include no blurred vision, headaches, neck pain, orthopnea, palpitations, peripheral edema or PND. (DOE) There are no associated agents to hypertension. Risk factors for coronary artery disease include dyslipidemia. Past treatments include angiotensin blockers, diuretics, alpha 1 blockers, beta blockers and calcium channel blockers. The current treatment provides moderate improvement. There are no compliance problems.  There is no history of angina, CAD/MI, CVA, left ventricular hypertrophy or PVD. There is no history of chronic renal disease, a hypertension causing med or renovascular disease.  Chest Pain  This is a chronic problem. The current episode started more than 1 year ago. The onset quality is gradual. The problem has been waxing and waning. The pain is present in the substernal region. The pain is at a severity of 8/10. The pain is moderate. The quality of the pain is described as tightness. The pain radiates to the upper back. Associated symptoms include a cough, diaphoresis, exertional chest pressure and shortness of breath. Pertinent negatives include no dizziness, headaches, irregular heartbeat, lower extremity edema, nausea, orthopnea, palpitations, PND or syncope. He has tried nothing for the symptoms. Risk factors include being elderly and lack of exercise.  His past medical history is significant for hypertension.  Pertinent negatives for past medical history include no PVD.    Lab Results  Component Value Date   NA 137 05/27/2022   K 3.8 05/27/2022   CO2 26 05/27/2022   GLUCOSE 123 (H) 05/27/2022   BUN 13 05/27/2022    CREATININE 0.95 05/27/2022   CALCIUM 8.9 05/27/2022   EGFR 88 05/27/2022   GFRNONAA >60 07/21/2020   Lab Results  Component Value Date   CHOL 145 05/27/2022   HDL 38 (L) 05/27/2022   LDLCALC 93 05/27/2022   TRIG 73 05/27/2022   CHOLHDL 3.5 05/25/2017   Lab Results  Component Value Date   TSH 1.380 05/25/2017   Lab Results  Component Value Date   HGBA1C 4.8 05/27/2022   Lab Results  Component Value Date   WBC 4.8 09/01/2021   HGB 15.1 09/01/2021   HCT 44.6 09/01/2021   MCV 92 09/01/2021   PLT 209 09/01/2021   Lab Results  Component Value Date   ALT 34 05/27/2022   AST 21 05/27/2022   ALKPHOS 73 05/27/2022   BILITOT 0.6 05/27/2022   No results found for: "25OHVITD2", "25OHVITD3", "VD25OH"   Review of Systems  Constitutional:  Positive for diaphoresis.  Eyes:  Positive for visual disturbance. Negative for blurred vision.       History glaucoma  Respiratory:  Positive for cough, chest tightness and shortness of breath. Negative for wheezing.   Cardiovascular:  Positive for chest pain. Negative for palpitations, orthopnea, syncope and PND.  Gastrointestinal:  Negative for nausea.  Musculoskeletal:  Negative for neck pain.  Neurological:  Negative for dizziness and headaches.    Patient Active Problem List   Diagnosis Date Noted   Colon cancer screening    Non-recurrent bilateral inguinal hernia without obstruction or gangrene    Glaucoma 07/18/2020   Age-related cataract of both eyes 07/18/2020   Essential hypertension 05/25/2017   Umbilical hernia without obstruction and without  gangrene 05/25/2017   Exertional chest pain 05/25/2017   Cervical radiculopathy 05/25/2017   FH: diabetes mellitus 05/25/2017    No Active Allergies  Past Surgical History:  Procedure Laterality Date   COLONOSCOPY WITH PROPOFOL N/A 08/10/2021   Procedure: COLONOSCOPY WITH PROPOFOL;  Surgeon: Midge Minium, MD;  Location: [facility];  Service: Endoscopy;  Laterality: N/A;    SHOULDER ARTHROSCOPY W/ ROTATOR CUFF REPAIR Left 2014   TRABECULECTOMY Left    UMBILICAL HERNIA REPAIR N/A 07/24/2020   Procedure: HERNIA REPAIR UMBILICAL ADULT, open;  Surgeon: Henrene Dodge, MD;  Location: [facility];  Service: General;  Laterality: N/A;   UPPER GI ENDOSCOPY     XI ROBOTIC ASSISTED INGUINAL HERNIA REPAIR WITH MESH Left 07/24/2020   Procedure: XI ROBOTIC ASSISTED INGUINAL HERNIA REPAIR WITH MESH;  Surgeon: Henrene Dodge, MD;  Location: [facility];  Service: General;  Laterality: Left;    Social History   Tobacco Use   Smoking status: Never   Smokeless tobacco: Never  Vaping Use   Vaping Use: Never used  Substance Use Topics   Alcohol use: Yes    Alcohol/week: 10.0 standard drinks of alcohol    Types: 10 Cans of beer per week   Drug use: Yes    Types: Marijuana    Comment: Occasionally     Medication list has been reviewed and updated.  Current Meds  Medication Sig   acetaminophen (TYLENOL) 500 MG tablet Take 2 tablets (1,000 mg total) by mouth every 6 (six) hours as needed for mild pain.   albuterol (VENTOLIN HFA) 108 (90 Base) MCG/ACT inhaler Inhale 2 puffs into the lungs every 6 (six) hours as needed for wheezing or shortness of breath.   amLODipine (NORVASC) 5 MG tablet Take 1 tablet (5 mg total) by mouth daily.   Ascorbic Acid (VITAMIN C) 1000 MG tablet Take 1,000 mg by mouth daily.   cholecalciferol (VITAMIN D3) 25 MCG (1000 UNIT) tablet Take 1,000 Units by mouth daily.   Cod Liver Oil 1000 MG CAPS Take 1,000 mg by mouth daily.   dorzolamide-timolol (COSOPT) 2-0.5 % ophthalmic solution Administer 1 drop to both eyes two (2) times a day.   doxazosin (CARDURA) 1 MG tablet Take 1 tablet (1 mg total) by mouth daily.   ibuprofen (ADVIL) 600 MG tablet Take 1 tablet (600 mg total) by mouth every 8 (eight) hours as needed for moderate pain.   Misc Natural Products (HORNY GOAT WEED PO) Take by mouth.   Multiple Vitamins-Minerals (MULTIVITAMIN MEN PO) Take by mouth.  Mega Man   valsartan-hydrochlorothiazide (DIOVAN-HCT) 160-25 MG tablet Take 1 tablet by mouth daily.   [DISCONTINUED] latanoprost (XALATAN) 0.005 % ophthalmic solution 1 drop at bedtime.       09/06/2022    9:50 AM 07/06/2022   10:37 AM 05/27/2022    9:55 AM 09/11/2021   10:59 AM  GAD 7 : Generalized Anxiety Score  Nervous, Anxious, on Edge 0 0 0 0  Control/stop worrying 0 0 0 0  Worry too much - different things 0 0 0 0  Trouble relaxing 0 0 0 0  Restless 0 0 0 0  Easily annoyed or irritable 0 0 0 0  Afraid - awful might happen 0 0 0 0  Total GAD 7 Score 0 0 0 0  Anxiety Difficulty Not difficult at all Not difficult at all Not difficult at all Not difficult at all       09/06/2022    9:50 AM 07/06/2022  10:37 AM 05/27/2022    9:54 AM  Depression screen PHQ 2/9  Decreased Interest 0 0 0  Down, Depressed, Hopeless 0 0 0  PHQ - 2 Score 0 0 0  Altered sleeping 0 0 0  Tired, decreased energy 0 0 0  Change in appetite 0 0 0  Feeling bad or failure about yourself  0 0 0  Trouble concentrating 0 0 0  Moving slowly or fidgety/restless 0 0 0  Suicidal thoughts 0 0 0  PHQ-9 Score 0 0 0  Difficult doing work/chores Not difficult at all Not difficult at all Not difficult at all    BP Readings from Last 3 Encounters:  09/06/22 122/78  07/06/22 120/76  05/27/22 102/62    Physical Exam Vitals reviewed.  HENT:     Head: Normocephalic.     Right Ear: Tympanic membrane and ear canal normal.     Left Ear: Tympanic membrane normal.     Nose: Nose normal.  Cardiovascular:     Rate and Rhythm: Normal rate and regular rhythm.     Chest Wall: PMI is not displaced.     Heart sounds: S1 normal and S2 normal. No murmur heard.    No systolic murmur is present.     No diastolic murmur is present.     No friction rub. No gallop. No S3 or S4 sounds.  Pulmonary:     Effort: Pulmonary effort is normal.     Breath sounds: Normal breath sounds. No stridor. No wheezing, rhonchi or rales.   Abdominal:     General: Abdomen is flat.     Palpations: Abdomen is soft.  Musculoskeletal:     Cervical back: Normal range of motion.  Neurological:     Mental Status: He is alert.     Wt Readings from Last 3 Encounters:  09/06/22 190 lb (86.2 kg)  07/06/22 190 lb (86.2 kg)  05/27/22 194 lb (88 kg)    BP 122/78   Pulse 84   Ht 5\' 7"  (1.702 m)   Wt 190 lb (86.2 kg)   SpO2 99%   BMI 29.76 kg/m   Assessment and Plan: 1. Other chest pain Patient with history of stable angina factors.  Previously evaluated last year by Dr. Gwen Pounds with normal echocardiogram with stress application.  Patient is continuing to have some substernal chest pain radiating to the upper back.  There is some diaphoresis associated with the ascending describes it 8/10.  Physical exam is unremarkable but we will obtain an EKG to see if there is any changes from previous EKG.:  Rate 79 sinus rhythm intervals normal R wave is noted and 2.5 mV which meets criteria for LVH.  Review of echo results nonsuggestive.  For the most part no ischemic changes were noted except some isoelectric T wave changes in V5 V6 which may be lead placement also.  Will continue to observe and if patient continues to have chest pain he has been encouraged to go to the ER or return to clinic. - EKG 12-Lead  2. Essential hypertension Chronic.  Controlled.  Stable.  Continue on current dosings of amlodipine 5 mg once a day Cardura 1 mg twice a day and valsartan hydrochlorothiazide 160-25 mg once a day.  Will recheck in 6 months.  3. Dyspnea on exertion Occasional dyspnea on exertion but for the most part quickly recovers and there is no orthopnea or PND.  Percussion of heart is nonenlarged.  And there is no gallops noted  on auscultation.  Will continue to observe for CHF symptomatology and patient will return in 6 months for reevaluation.  Review stress echo notes mild LVH but is symptomatologies continue will will refer to cardiology for  reevaluation.    Elizabeth Sauer, MD

## 2022-09-06 NOTE — Addendum Note (Signed)
Addended by: Everitt Amber on: 09/06/2022 12:38 PM   Modules accepted: Level of Service

## 2022-09-09 ENCOUNTER — Ambulatory Visit: Payer: Medicare Other | Admitting: Family Medicine

## 2022-09-13 ENCOUNTER — Ambulatory Visit
Admission: RE | Admit: 2022-09-13 | Discharge: 2022-09-13 | Disposition: A | Discharge status: Home / Self Care | Payer: Medicare Other | Source: Ambulatory Visit | Attending: Family Medicine | Admitting: Family Medicine

## 2022-09-13 ENCOUNTER — Ambulatory Visit
Admission: RE | Admit: 2022-09-13 | Discharge: 2022-09-13 | Disposition: A | Discharge status: Home / Self Care | Payer: Medicare Other | Attending: Family Medicine | Admitting: Family Medicine

## 2022-09-13 ENCOUNTER — Ambulatory Visit (INDEPENDENT_AMBULATORY_CARE_PROVIDER_SITE_OTHER): Payer: Medicare Other | Admitting: Family Medicine

## 2022-09-13 ENCOUNTER — Ambulatory Visit: Payer: Medicare Other | Admitting: Family Medicine

## 2022-09-13 ENCOUNTER — Encounter: Payer: Self-pay | Admitting: Family Medicine

## 2022-09-13 VITALS — BP 120/84 | HR 58 | Ht 67.0 in | Wt 188.0 lb

## 2022-09-13 DIAGNOSIS — R0609 Other forms of dyspnea: Secondary | ICD-10-CM | POA: Insufficient documentation

## 2022-09-13 DIAGNOSIS — J4520 Mild intermittent asthma, uncomplicated: Secondary | ICD-10-CM

## 2022-09-13 DIAGNOSIS — M546 Pain in thoracic spine: Secondary | ICD-10-CM

## 2022-09-13 DIAGNOSIS — R0600 Dyspnea, unspecified: Secondary | ICD-10-CM | POA: Diagnosis not present

## 2022-09-13 DIAGNOSIS — G8929 Other chronic pain: Secondary | ICD-10-CM

## 2022-09-13 DIAGNOSIS — R079 Chest pain, unspecified: Secondary | ICD-10-CM

## 2022-09-13 MED ORDER — BUDESONIDE-FORMOTEROL FUMARATE 80-4.5 MCG/ACT IN AERO
2.0000 | INHALATION_SPRAY | Freq: Two times a day (BID) | RESPIRATORY_TRACT | 3 refills | Status: DC
Start: 2022-09-13 — End: 2023-04-08

## 2022-09-13 NOTE — Progress Notes (Signed)
Date:  09/13/2022   Name:  Jason [name]   DOB:  1955/05/13   MRN:  [id number]   Chief Complaint: Annual Exam  Chest Pain  This is a recurrent problem. The current episode started more than 1 month ago. The problem occurs intermittently. The problem has been gradually improving. The pain is mild. Associated symptoms include back pain and shortness of breath. Pertinent negatives include no fever, hemoptysis, leg pain, orthopnea, PND, syncope or vomiting. The treatment provided mild relief.  Shortness of Breath This is a chronic problem. The current episode started more than 1 month ago ("steys the same"). Associated symptoms include chest pain and wheezing. Pertinent negatives include no fever, hemoptysis, leg pain, leg swelling, orthopnea, PND, rhinorrhea, syncope or vomiting. The symptoms are aggravated by any activity (exertion). He has tried beta agonist inhalers for the symptoms. The treatment provided moderate relief. His past medical history is significant for asthma.  Back Pain This is a new problem. The current episode started more than 1 month ago. The problem has been waxing and waning since onset. The pain is present in the thoracic spine. The quality of the pain is described as aching. The pain does not radiate. Associated symptoms include chest pain. Pertinent negatives include no fever or leg pain.    Lab Results  Component Value Date   NA 137 05/27/2022   K 3.8 05/27/2022   CO2 26 05/27/2022   GLUCOSE 123 (H) 05/27/2022   BUN 13 05/27/2022   CREATININE 0.95 05/27/2022   CALCIUM 8.9 05/27/2022   EGFR 88 05/27/2022   GFRNONAA >60 07/21/2020   Lab Results  Component Value Date   CHOL 145 05/27/2022   HDL 38 (L) 05/27/2022   LDLCALC 93 05/27/2022   TRIG 73 05/27/2022   CHOLHDL 3.5 05/25/2017   Lab Results  Component Value Date   TSH 1.380 05/25/2017   Lab Results  Component Value Date   HGBA1C 4.8 05/27/2022   Lab Results  Component Value Date   WBC 4.8  09/01/2021   HGB 15.1 09/01/2021   HCT 44.6 09/01/2021   MCV 92 09/01/2021   PLT 209 09/01/2021   Lab Results  Component Value Date   ALT 34 05/27/2022   AST 21 05/27/2022   ALKPHOS 73 05/27/2022   BILITOT 0.6 05/27/2022   No results found for: "25OHVITD2", "25OHVITD3", "VD25OH"   Review of Systems  Constitutional:  Negative for fever.  HENT:  Negative for rhinorrhea.   Respiratory:  Positive for shortness of breath and wheezing. Negative for hemoptysis.   Cardiovascular:  Positive for chest pain. Negative for orthopnea, leg swelling, syncope and PND.  Gastrointestinal:  Negative for vomiting.  Musculoskeletal:  Positive for back pain.    Patient Active Problem List   Diagnosis Date Noted   Colon cancer screening    Non-recurrent bilateral inguinal hernia without obstruction or gangrene    Glaucoma 07/18/2020   Age-related cataract of both eyes 07/18/2020   Essential hypertension 05/25/2017   Umbilical hernia without obstruction and without gangrene 05/25/2017   Exertional chest pain 05/25/2017   Cervical radiculopathy 05/25/2017   FH: diabetes mellitus 05/25/2017    No Active Allergies  Past Surgical History:  Procedure Laterality Date   COLONOSCOPY WITH PROPOFOL N/A 08/10/2021   Procedure: COLONOSCOPY WITH PROPOFOL;  Surgeon: Midge Minium, MD;  Location: [facility];  Service: Endoscopy;  Laterality: N/A;   SHOULDER ARTHROSCOPY W/ ROTATOR CUFF REPAIR Left 2014   TRABECULECTOMY Left  UMBILICAL HERNIA REPAIR N/A 07/24/2020   Procedure: HERNIA REPAIR UMBILICAL ADULT, open;  Surgeon: Henrene Dodge, MD;  Location: [facility];  Service: General;  Laterality: N/A;   UPPER GI ENDOSCOPY     XI ROBOTIC ASSISTED INGUINAL HERNIA REPAIR WITH MESH Left 07/24/2020   Procedure: XI ROBOTIC ASSISTED INGUINAL HERNIA REPAIR WITH MESH;  Surgeon: Henrene Dodge, MD;  Location: [facility];  Service: General;  Laterality: Left;    Social History   Tobacco Use   Smoking status:  Never   Smokeless tobacco: Never  Vaping Use   Vaping Use: Never used  Substance Use Topics   Alcohol use: Yes    Alcohol/week: 10.0 standard drinks of alcohol    Types: 10 Cans of beer per week   Drug use: Yes    Types: Marijuana    Comment: Occasionally     Medication list has been reviewed and updated.  Current Meds  Medication Sig   acetaminophen (TYLENOL) 500 MG tablet Take 2 tablets (1,000 mg total) by mouth every 6 (six) hours as needed for mild pain.   albuterol (VENTOLIN HFA) 108 (90 Base) MCG/ACT inhaler Inhale 2 puffs into the lungs every 6 (six) hours as needed for wheezing or shortness of breath.   amLODipine (NORVASC) 5 MG tablet Take 1 tablet (5 mg total) by mouth daily.   Ascorbic Acid (VITAMIN C) 1000 MG tablet Take 1,000 mg by mouth daily.   cholecalciferol (VITAMIN D3) 25 MCG (1000 UNIT) tablet Take 1,000 Units by mouth daily.   Cod Liver Oil 1000 MG CAPS Take 1,000 mg by mouth daily.   dorzolamide-timolol (COSOPT) 2-0.5 % ophthalmic solution Administer 1 drop to both eyes two (2) times a day.   doxazosin (CARDURA) 1 MG tablet Take 1 tablet (1 mg total) by mouth daily.   ibuprofen (ADVIL) 600 MG tablet Take 1 tablet (600 mg total) by mouth every 8 (eight) hours as needed for moderate pain.   Misc Natural Products (HORNY GOAT WEED PO) Take by mouth.   Multiple Vitamins-Minerals (MULTIVITAMIN MEN PO) Take by mouth. Mega Man   valsartan-hydrochlorothiazide (DIOVAN-HCT) 160-25 MG tablet Take 1 tablet by mouth daily.       09/13/2022    8:00 AM 09/06/2022    9:50 AM 07/06/2022   10:37 AM 05/27/2022    9:55 AM  GAD 7 : Generalized Anxiety Score  Nervous, Anxious, on Edge 0 0 0 0  Control/stop worrying 0 0 0 0  Worry too much - different things 0 0 0 0  Trouble relaxing 0 0 0 0  Restless 0 0 0 0  Easily annoyed or irritable 0 0 0 0  Afraid - awful might happen 0 0 0 0  Total GAD 7 Score 0 0 0 0  Anxiety Difficulty Not difficult at all Not difficult at all Not  difficult at all Not difficult at all       09/13/2022    8:00 AM 09/06/2022    9:50 AM 07/06/2022   10:37 AM  Depression screen PHQ 2/9  Decreased Interest 0 0 0  Down, Depressed, Hopeless 0 0 0  PHQ - 2 Score 0 0 0  Altered sleeping 0 0 0  Tired, decreased energy 0 0 0  Change in appetite 0 0 0  Feeling bad or failure about yourself  0 0 0  Trouble concentrating 0 0 0  Moving slowly or fidgety/restless 0 0 0  Suicidal thoughts 0 0 0  PHQ-9 Score 0 0  0  Difficult doing work/chores Not difficult at all Not difficult at all Not difficult at all    BP Readings from Last 3 Encounters:  09/13/22 120/84  09/06/22 122/78  07/06/22 120/76    Physical Exam Vitals and nursing note reviewed.  HENT:     Head: Normocephalic.     Right Ear: Tympanic membrane and ear canal normal.     Left Ear: Tympanic membrane and ear canal normal.     Nose: Nose normal.     Mouth/Throat:     Mouth: Mucous membranes are moist.     Pharynx: Oropharynx is clear.  Eyes:     Pupils: Pupils are equal, round, and reactive to light.  Cardiovascular:     Rate and Rhythm: Normal rate.     Pulses: Normal pulses.     Heart sounds: Normal heart sounds and S1 normal. No murmur heard.    No systolic murmur is present.     No diastolic murmur is present.     No friction rub. No gallop. No S3 or S4 sounds.  Pulmonary:     Effort: Pulmonary effort is normal. No respiratory distress.     Breath sounds: Normal breath sounds. No stridor. No wheezing, rhonchi or rales.  Chest:     Chest wall: No tenderness.  Abdominal:     Tenderness: There is no abdominal tenderness. There is no guarding or rebound.  Musculoskeletal:     Cervical back: Normal range of motion and neck supple.     Thoracic back: No swelling, deformity, spasms or tenderness. Normal range of motion.       Back:     Right lower leg: No edema.     Left lower leg: No edema.     Wt Readings from Last 3 Encounters:  09/13/22 188 lb (85.3 kg)   09/06/22 190 lb (86.2 kg)  07/06/22 190 lb (86.2 kg)    BP 120/84   Pulse (!) 58   Ht 5\' 7"  (1.702 m)   Wt 188 lb (85.3 kg)   SpO2 97%   BMI 29.44 kg/m   Assessment and Plan: Once again we are scheduled for a complete physical exam and initial evaluation patient has an additional concern like the last week it was chest pain and today it is shortness of breath. 1. Dyspnea on exertion Chronic.  Worse over the past month.  Episodic.  Patient is no symptoms of hemoptysis shortness of breath at rest nor persistent cough.  Patient does cough to clear his throat which may be a symptom of reactive airway disease.  Already exam is normal with no murmur, no gallop, no palpable defect or lower extremity swelling.  No tenderness over the calves.  Given the persistent nature of the dyspnea we will obtain a chest x-ray. - DG Chest 2 View  2. Mild intermittent reactive airway disease without complication Chronic.  Controlled.  Stable.  No rales rhonchi wheezes or rub.  On physical exam.  Patient has not been taking his albuterol except on an as-needed basis which he has not recognize that his dyspnea and feeling as if he cannot get a good breath is probably secondary to reactive airway disease and I gave reiterated that he needs to be taking his short acting beta agonist as well as we will initiate Symbicort 80-4 0.5 twice a day. - DG Chest 2 View - budesonide-formoterol (SYMBICORT) 80-4.5 MCG/ACT inhaler; Inhale 2 puffs into the lungs 2 (two) times daily.  Dispense: 1  each; Refill: 3  3. Chronic right-sided thoracic back pain Cyst described as "lung pain" although I think that this may be more of a chest wall concern but there is no palpable tenderness or spasm.  We will obtain a thoracic spine to see if there is any arthritic concerns and proceed accordingly. - DG Thoracic Spine 2 View  4. Chest pain of uncertain etiology Patient continues to have cardiac like pain and upon review of the stress test  it was normal.  But we will refer to cardiology for for further evaluation. - Ambulatory referral to Cardiology    Elizabeth Sauer, MD

## 2022-09-30 DIAGNOSIS — H524 Presbyopia: Secondary | ICD-10-CM | POA: Diagnosis not present

## 2022-09-30 DIAGNOSIS — Z0100 Encounter for examination of eyes and vision without abnormal findings: Secondary | ICD-10-CM | POA: Diagnosis not present

## 2022-10-22 DIAGNOSIS — H401133 Primary open-angle glaucoma, bilateral, severe stage: Secondary | ICD-10-CM | POA: Diagnosis not present

## 2022-12-01 ENCOUNTER — Other Ambulatory Visit: Payer: Self-pay | Admitting: Family Medicine

## 2022-12-01 DIAGNOSIS — N401 Enlarged prostate with lower urinary tract symptoms: Secondary | ICD-10-CM

## 2022-12-01 DIAGNOSIS — I10 Essential (primary) hypertension: Secondary | ICD-10-CM

## 2022-12-08 DIAGNOSIS — G319 Degenerative disease of nervous system, unspecified: Secondary | ICD-10-CM | POA: Diagnosis not present

## 2022-12-08 NOTE — Progress Notes (Unsigned)
Cardiology Office Note  Date:  12/09/2022   ID:  Jason [name], DOB 01-Jan-1956, MRN [id number]  PCP:  Duanne Limerick, MD   Chief Complaint  Patient presents with   New Patient (Initial Visit)    Ref by Dr. Yetta Barre for chest pain & shortness of breath. Former Dr. Gwen Pounds patient at [facility] clinic. Medications reviewed by the patient verbally.     HPI:  [name] is a 67 year old gentleman with past medical history of Reactive airway disease/asthma on inhalers Hypertension Chronic chest pain Non smoker, no diabetes,  Who presents by referral from Dr. Yetta Barre for chest pain  Prior workup for chest pain through [facility] Completed exercise stress echo for shortness of breath, chest pain Normal study May 2023  Recent lab work reviewed A1c 4.8 Total cholesterol 145 LDL 93  Prior imaging reviewed May 2023  normal Stress Echocardiogram  WITH MILD LVH   When getting up in the morning, appreciate pain around his lower chest Episodes of shortness of breath  Feels his asthma is stable on inhalers  EKG personally reviewed by myself on todays visit EKG Interpretation Date/Time:  Thursday December 09 2022 15:00:47 EDT Ventricular Rate:  60 PR Interval:  144 QRS Duration:  76 QT Interval:  426 QTC Calculation: 426 R Axis:   -4  Text Interpretation: Normal sinus rhythm Minimal voltage criteria for LVH, may be normal variant ( R in aVL ) Nonspecific T wave abnormality When compared with ECG of 21-Jul-2020 13:35, Nonspecific T wave abnormality no longer evident in Inferior leads Nonspecific T wave abnormality has replaced inverted T waves in Lateral leads Confirmed by Julien Nordmann [id number]) on 12/09/2022 3:13:52 PM    PMH:   has a past medical history of Asthma, Hypertension, and Wears dentures.  PSH:    Past Surgical History:  Procedure Laterality Date   COLONOSCOPY WITH PROPOFOL N/A 08/10/2021   Procedure: COLONOSCOPY WITH PROPOFOL;  Surgeon: Midge Minium, MD;  Location: [facility];  Service: Endoscopy;  Laterality: N/A;   SHOULDER ARTHROSCOPY W/ ROTATOR CUFF REPAIR Left 2014   TRABECULECTOMY Left    UMBILICAL HERNIA REPAIR N/A 07/24/2020   Procedure: HERNIA REPAIR UMBILICAL ADULT, open;  Surgeon: Henrene Dodge, MD;  Location: [facility];  Service: General;  Laterality: N/A;   UPPER GI ENDOSCOPY     XI ROBOTIC ASSISTED INGUINAL HERNIA REPAIR WITH MESH Left 07/24/2020   Procedure: XI ROBOTIC ASSISTED INGUINAL HERNIA REPAIR WITH MESH;  Surgeon: Henrene Dodge, MD;  Location: [facility];  Service: General;  Laterality: Left;    Current Outpatient Medications  Medication Sig Dispense Refill   acetaminophen (TYLENOL) 500 MG tablet Take 2 tablets (1,000 mg total) by mouth every 6 (six) hours as needed for mild pain.     albuterol (VENTOLIN HFA) 108 (90 Base) MCG/ACT inhaler Inhale 2 puffs into the lungs every 6 (six) hours as needed for wheezing or shortness of breath. 18 g 2   amLODipine (NORVASC) 5 MG tablet Take 1 tablet (5 mg total) by mouth daily. 90 tablet 1   Ascorbic Acid (VITAMIN C) 1000 MG tablet Take 1,000 mg by mouth daily.     budesonide-formoterol (SYMBICORT) 80-4.5 MCG/ACT inhaler Inhale 2 puffs into the lungs 2 (two) times daily. 1 each 3   cholecalciferol (VITAMIN D3) 25 MCG (1000 UNIT) tablet Take 1,000 Units by mouth daily.     Cod Liver Oil 1000 MG CAPS Take 1,000 mg by mouth daily.     dorzolamide-timolol (COSOPT) 2-0.5 %  ophthalmic solution Administer 1 drop to both eyes two (2) times a day.     doxazosin (CARDURA) 1 MG tablet TAKE 1 TABLET BY MOUTH EVERY DAY 90 tablet 0   ibuprofen (ADVIL) 600 MG tablet Take 1 tablet (600 mg total) by mouth every 8 (eight) hours as needed for moderate pain. 60 tablet 1   Misc Natural Products (HORNY GOAT WEED PO) Take by mouth.     Multiple Vitamins-Minerals (MULTIVITAMIN MEN PO) Take by mouth. Mega Man     valsartan-hydrochlorothiazide (DIOVAN-HCT) 160-25 MG tablet Take 1 tablet by mouth daily. 90 tablet 1   No  current facility-administered medications for this visit.    Allergies:   Patient has no active allergies.   Social History:  The patient  reports that he has never smoked. He has never used smokeless tobacco. He reports current alcohol use of about 10.0 standard drinks of alcohol per week. He reports current drug use. Drug: Marijuana.   Family History:   family history includes Diabetes in his mother; Prostatitis in his father and paternal aunt.    Review of Systems: Review of Systems  Constitutional: Negative.   HENT: Negative.    Respiratory:  Positive for shortness of breath.   Cardiovascular:  Positive for chest pain.  Gastrointestinal: Negative.   Musculoskeletal: Negative.   Neurological: Negative.   Psychiatric/Behavioral: Negative.    All other systems reviewed and are negative.    PHYSICAL EXAM: VS:  BP 120/80 (BP Location: Right Arm, Patient Position: Sitting, Cuff Size: Normal)   Pulse 60   Ht 5\' 7"  (1.702 m)   Wt 188 lb 8 oz (85.5 kg)   SpO2 97%   BMI 29.52 kg/m  , BMI Body mass index is 29.52 kg/m. GEN: Well nourished, well developed, in no acute distress HEENT: normal Neck: no JVD, carotid bruits, or masses Cardiac: RRR; no murmurs, rubs, or gallops,no edema  Respiratory:  clear to auscultation bilaterally, normal work of breathing GI: soft, nontender, nondistended, + BS MS: no deformity or atrophy Skin: warm and dry, no rash Neuro:  Strength and sensation are intact Psych: euthymic mood, full affect   Recent Labs: 05/27/2022: ALT 34; BUN 13; Creatinine, Ser 0.95; Potassium 3.8; Sodium 137    Lipid Panel Lab Results  Component Value Date   CHOL 145 05/27/2022   HDL 38 (L) 05/27/2022   LDLCALC 93 05/27/2022   TRIG 73 05/27/2022      Wt Readings from Last 3 Encounters:  12/09/22 188 lb 8 oz (85.5 kg)  09/13/22 188 lb (85.3 kg)  09/06/22 190 lb (86.2 kg)     ASSESSMENT AND PLAN:  Problem List Items Addressed This Visit       Cardiology  Problems   Essential hypertension   Relevant Orders   EKG 12-Lead (Completed)     Other   Exertional chest pain - Primary   Relevant Orders   EKG 12-Lead (Completed)   Other Visit Diagnoses     SOB (shortness of breath)       Relevant Orders   EKG 12-Lead (Completed)      Chest pain Symptoms concerning for angina Prior echo stress test May 2023 Recommended alternate imaging study, we have ordered cardiac CTA for further evaluation  Shortness of breath Ischemic workup as above Reports long history of reactive airway disease, on inhalers  Cardiac risk factors Non-smoker, no diabetes Currently not on a statin Reasonable cholesterol numbers  Essential hypertension Blood pressure is well controlled on today's  visit. No changes made to the medications.    Total encounter time more than 60 minutes  Greater than 50% was spent in counseling and coordination of care with the patient    Signed, Dossie Arbour, M.D., Ph.D. [facility] [place], [place] [phone]

## 2022-12-09 ENCOUNTER — Other Ambulatory Visit: Payer: Self-pay | Admitting: Cardiovascular Disease

## 2022-12-09 ENCOUNTER — Ambulatory Visit: Payer: Medicare Other | Attending: Cardiovascular Disease | Admitting: Cardiovascular Disease

## 2022-12-09 ENCOUNTER — Encounter: Payer: Self-pay | Admitting: Cardiovascular Disease

## 2022-12-09 VITALS — BP 120/80 | HR 60 | Ht 67.0 in | Wt 188.5 lb

## 2022-12-09 DIAGNOSIS — R079 Chest pain, unspecified: Secondary | ICD-10-CM | POA: Diagnosis not present

## 2022-12-09 DIAGNOSIS — I10 Essential (primary) hypertension: Secondary | ICD-10-CM

## 2022-12-09 DIAGNOSIS — R0602 Shortness of breath: Secondary | ICD-10-CM

## 2022-12-09 DIAGNOSIS — Z79899 Other long term (current) drug therapy: Secondary | ICD-10-CM

## 2022-12-09 DIAGNOSIS — I209 Angina pectoris, unspecified: Secondary | ICD-10-CM

## 2022-12-09 LAB — BASIC METABOLIC PANEL
BUN/Creatinine Ratio: 17 (ref 10–24)
BUN: 14 mg/dL (ref 8–27)
CO2: 23 mmol/L (ref 20–29)
Calcium: 9.2 mg/dL (ref 8.6–10.2)
Chloride: 100 mmol/L (ref 96–106)
Creatinine, Ser: 0.84 mg/dL (ref 0.76–1.27)
Glucose: 95 mg/dL (ref 70–99)
Potassium: 3.9 mmol/L (ref 3.5–5.2)
Sodium: 138 mmol/L (ref 134–144)
eGFR: 96 mL/min/{1.73_m2} (ref >59)

## 2022-12-09 MED ORDER — METOPROLOL TARTRATE 50 MG PO TABS: 50.0000 mg | ORAL_TABLET | Freq: Once | ORAL | 0 refills | Status: DC

## 2022-12-09 NOTE — Patient Instructions (Addendum)
Medication Instructions:  No changes  If you need a refill on your cardiac medications before your next appointment, please call your pharmacy.   Lab work: Your provider would like for you to have following labs drawn today BMP.    Testing/Procedures: Cardiac CTA for angina, shortness of breath   Your cardiac CT will be scheduled at one of the below locations:    [facility] [address] [place], [place] [address] [phone]  OR   [facility] [address] [place], [place] [address] [phone]   If scheduled at [facility] or [facility], please arrive 15 mins early for check-in and test prep.  There is spacious parking and easy access to the radiology department from the [facility] entrance. Please enter here and check-in with the desk attendant.   Please follow these instructions carefully (unless otherwise directed):  An IV will be required for this test and Nitroglycerin will be given.    On the Night Before the Test: Be sure to Drink plenty of water. Do not consume any caffeinated/decaffeinated beverages or chocolate 12 hours prior to your test. Do not take any antihistamines 12 hours prior to your test.  On the Day of the Test: Drink plenty of water until 1 hour prior to the test. Do not eat any food 1 hour prior to test. You may take your regular medications prior to the test.  Take metoprolol (Lopressor) 50 mg two hours prior to test. Hold Amlodipine and Diovan the morning of your CT. If you take Furosemide/Hydrochlorothiazide/Spironolactone, please HOLD on the morning of the test.       After the Test: Drink plenty of water. After receiving IV contrast, you may experience a mild flushed feeling. This is normal. On occasion, you may experience a mild rash up to 24 hours after the test. This is not dangerous. If this  occurs, you can take Benadryl 25 mg and increase your fluid intake. If you experience trouble breathing, this can be serious. If it is severe call 911 IMMEDIATELY. If it is mild, please call our office. If you take any of these medications: Glipizide/Metformin, Avandament, Glucavance, please do not take 48 hours after completing test unless otherwise instructed.  We will call to schedule your test 2-4 weeks out understanding that some insurance companies will need an authorization prior to the service being performed.   For more information and frequently asked questions, please visit our website : [email or web address]  For non-scheduling related questions, please contact the cardiac imaging nurse navigator should you have any questions/concerns: Cardiac Imaging Nurse Navigators Direct Office Dial: [phone]   For scheduling needs, including cancellations and rescheduling, please call [place], [phone].   Follow-Up: At [facility], you and your health needs are our priority.  As part of our continuing mission to provide you with exceptional heart care, we have created designated Provider Care Teams.  These Care Teams include your primary Cardiologist (physician) and Advanced Practice Providers (APPs -  Physician Assistants and Nurse Practitioners) who all work together to provide you with the care you need, when you need it.  You will need a follow up appointment as needed  Providers on your designated Care Team:   Nicolasa Ducking, NP Eula Listen, PA-C Cadence Fransico Michael, [place]  COVID-19 Vaccine Information can be found at: [email or web address] For questions related to vaccine distribution or appointments, please email [email or web address] or call [phone].

## 2022-12-21 ENCOUNTER — Ambulatory Visit: Payer: Medicare Other | Admitting: Cardiovascular Disease

## 2022-12-29 DIAGNOSIS — H401133 Primary open-angle glaucoma, bilateral, severe stage: Secondary | ICD-10-CM | POA: Diagnosis not present

## 2022-12-29 DIAGNOSIS — H25811 Combined forms of age-related cataract, right eye: Secondary | ICD-10-CM | POA: Diagnosis not present

## 2023-01-06 ENCOUNTER — Ambulatory Visit
Admission: RE | Admit: 2023-01-06 | Discharge: 2023-01-06 | Disposition: A | Discharge status: Home / Self Care | Payer: Medicare Other | Source: Ambulatory Visit | Attending: Cardiovascular Disease | Admitting: Cardiovascular Disease

## 2023-01-06 DIAGNOSIS — I209 Angina pectoris, unspecified: Secondary | ICD-10-CM | POA: Insufficient documentation

## 2023-01-06 DIAGNOSIS — R0602 Shortness of breath: Secondary | ICD-10-CM | POA: Insufficient documentation

## 2023-01-06 DIAGNOSIS — R079 Chest pain, unspecified: Secondary | ICD-10-CM | POA: Diagnosis not present

## 2023-01-06 MED ORDER — IOHEXOL 350 MG/ML SOLN
75.0000 mL | Freq: Once | INTRAVENOUS | Status: AC | PRN
  Administered 2023-01-06: 75 mL via INTRAVENOUS

## 2023-01-06 MED ORDER — SODIUM CHLORIDE 0.9 % IV SOLN: INTRAVENOUS | Status: DC

## 2023-01-06 MED ORDER — NITROGLYCERIN 0.4 MG SL SUBL
0.8000 mg | SUBLINGUAL_TABLET | Freq: Once | SUBLINGUAL | Status: AC
  Administered 2023-01-06: 0.8 mg via SUBLINGUAL

## 2023-01-06 NOTE — Progress Notes (Signed)
Patient tolerated CT well. Drank water after. Vital signs stable encourage to drink water throughout day.Reasons explained and verbalized understanding. Ambulated steady gait.  

## 2023-01-12 ENCOUNTER — Ambulatory Visit: Admission: RE | Admit: 2023-01-12 | Payer: Medicare Other | Source: Ambulatory Visit

## 2023-02-02 ENCOUNTER — Other Ambulatory Visit: Payer: Self-pay | Admitting: Family Medicine

## 2023-02-02 DIAGNOSIS — I10 Essential (primary) hypertension: Secondary | ICD-10-CM

## 2023-02-03 NOTE — Telephone Encounter (Signed)
Requested Prescriptions  Pending Prescriptions Disp Refills   valsartan-hydrochlorothiazide (DIOVAN-HCT) 160-25 MG tablet [Pharmacy Med Name: VALSARTAN-HCTZ 160-25 MG TAB] 90 tablet 1    Sig: TAKE 1 TABLET BY MOUTH EVERY DAY     Cardiovascular: ARB + Diuretic Combos Passed - 02/02/2023 12:50 PM      Passed - K in normal range and within 180 days    Potassium  Date Value Ref Range Status  12/09/2022 3.9 3.5 - 5.2 mmol/L Final         Passed - Na in normal range and within 180 days    Sodium  Date Value Ref Range Status  12/09/2022 138 134 - 144 mmol/L Final         Passed - Cr in normal range and within 180 days    Creatinine, Ser  Date Value Ref Range Status  12/09/2022 0.84 0.76 - 1.27 mg/dL Final         Passed - eGFR is 10 or above and within 180 days    GFR calc Af Amer  Date Value Ref Range Status  06/23/2020 90 >59 mL/min/1.73 Final    Comment:    **In accordance with recommendations from the NKF-ASN Task force,**   [facility] is in the process of updating its eGFR calculation to the   2021 CKD-EPI creatinine equation that estimates kidney function   without a race variable.    GFR, Estimated  Date Value Ref Range Status  07/21/2020 >60 >60 mL/min Final    Comment:    (NOTE) Calculated using the CKD-EPI Creatinine Equation (2021)    eGFR  Date Value Ref Range Status  12/09/2022 96 >59 mL/min/1.73 Final         Passed - Patient is not pregnant      Passed - Last BP in normal range    BP Readings from Last 1 Encounters:  01/06/23 106/75         Passed - Valid encounter within last 6 months    Recent Outpatient Visits           4 months ago Dyspnea on exertion   [place] [facility]   5 months ago Other chest pain   [place] [facility]   7 months ago Essential hypertension   [place] [facility]   8 months ago Essential hypertension   [place] [facility]   1 year ago Essential hypertension   [place] [facility]       Future Appointments             In 7 months Duanne Limerick, MD [facility] at [facility]

## 2023-03-02 ENCOUNTER — Other Ambulatory Visit: Payer: Self-pay | Admitting: Family Medicine

## 2023-03-02 DIAGNOSIS — N401 Enlarged prostate with lower urinary tract symptoms: Secondary | ICD-10-CM

## 2023-03-02 DIAGNOSIS — I10 Essential (primary) hypertension: Secondary | ICD-10-CM

## 2023-04-08 ENCOUNTER — Other Ambulatory Visit: Payer: Self-pay | Admitting: Family Medicine

## 2023-04-08 DIAGNOSIS — J4520 Mild intermittent asthma, uncomplicated: Secondary | ICD-10-CM

## 2023-04-08 MED ORDER — BUDESONIDE-FORMOTEROL FUMARATE 80-4.5 MCG/ACT IN AERO
2.0000 | INHALATION_SPRAY | Freq: Two times a day (BID) | RESPIRATORY_TRACT | 3 refills | Status: DC
Start: 2023-04-08 — End: 2023-04-11

## 2023-04-11 ENCOUNTER — Other Ambulatory Visit: Payer: Self-pay | Admitting: Family Medicine

## 2023-04-11 DIAGNOSIS — J4520 Mild intermittent asthma, uncomplicated: Secondary | ICD-10-CM

## 2023-05-03 ENCOUNTER — Other Ambulatory Visit: Payer: Self-pay | Admitting: Family Medicine

## 2023-05-03 DIAGNOSIS — I10 Essential (primary) hypertension: Secondary | ICD-10-CM

## 2023-05-16 ENCOUNTER — Other Ambulatory Visit: Payer: Self-pay | Admitting: Family Medicine

## 2023-05-16 DIAGNOSIS — I10 Essential (primary) hypertension: Secondary | ICD-10-CM

## 2023-05-18 ENCOUNTER — Other Ambulatory Visit: Payer: Self-pay | Admitting: Family Medicine

## 2023-05-18 DIAGNOSIS — I10 Essential (primary) hypertension: Secondary | ICD-10-CM

## 2023-05-18 MED ORDER — AMLODIPINE BESYLATE 5 MG PO TABS
5.0000 mg | ORAL_TABLET | Freq: Every day | ORAL | 0 refills | Status: DC
Start: 2023-05-18 — End: 2023-05-31

## 2023-05-27 ENCOUNTER — Telehealth: Payer: Self-pay | Admitting: Family Medicine

## 2023-05-27 NOTE — Telephone Encounter (Signed)
Medication Refill -  Most Recent Primary Care Visit:  Provider: Duanne Limerick  Department: [facility]  Visit Type: OFFICE VISIT  Date: 09/13/2022  Medication: amLODipine (NORVASC) 5 MG tablet  Pt requesting a 90 day supply, he doesn't want to pick up the 30 day  Has the patient contacted their pharmacy? Yes  .) (Agent: If yes, when and what did the pharmacy advise?)contact pcp   Is this the correct pharmacy for this prescription? yes  This is the patient's preferred pharmacy:  [facility] [phone] Dan Humphreys, [place] - [address] [place] [address] Phone: [phone] Fax: [phone]   Has the prescription been filled recently? no  Is the patient out of the medication? yes  Has the patient been seen for an appointment in the last year OR does the patient have an upcoming appointment? yes  Can we respond through MyChart? yes  Agent: Please be advised that Rx refills may take up to 3 business days. We ask that you follow-up with your pharmacy.

## 2023-05-31 ENCOUNTER — Ambulatory Visit (INDEPENDENT_AMBULATORY_CARE_PROVIDER_SITE_OTHER): Payer: Medicare Other | Admitting: Family Medicine

## 2023-05-31 ENCOUNTER — Encounter: Payer: Self-pay | Admitting: Family Medicine

## 2023-05-31 VITALS — BP 128/78 | HR 80 | Ht 67.0 in | Wt 189.0 lb

## 2023-05-31 DIAGNOSIS — R3911 Hesitancy of micturition: Secondary | ICD-10-CM | POA: Diagnosis not present

## 2023-05-31 DIAGNOSIS — N401 Enlarged prostate with lower urinary tract symptoms: Secondary | ICD-10-CM

## 2023-05-31 DIAGNOSIS — I10 Essential (primary) hypertension: Secondary | ICD-10-CM | POA: Diagnosis not present

## 2023-05-31 DIAGNOSIS — J4520 Mild intermittent asthma, uncomplicated: Secondary | ICD-10-CM

## 2023-05-31 MED ORDER — ALBUTEROL SULFATE HFA 108 (90 BASE) MCG/ACT IN AERS
2.0000 | INHALATION_SPRAY | Freq: Four times a day (QID) | RESPIRATORY_TRACT | 2 refills | Status: AC | PRN
Start: 2023-05-31 — End: ?

## 2023-05-31 MED ORDER — DOXAZOSIN MESYLATE 1 MG PO TABS
1.0000 mg | ORAL_TABLET | Freq: Every day | ORAL | 1 refills | Status: AC
Start: 2023-05-31 — End: ?

## 2023-05-31 MED ORDER — AMLODIPINE BESYLATE 5 MG PO TABS
5.0000 mg | ORAL_TABLET | Freq: Every day | ORAL | 1 refills | Status: AC
Start: 2023-05-31 — End: ?

## 2023-05-31 MED ORDER — DOXAZOSIN MESYLATE 1 MG PO TABS
1.0000 mg | ORAL_TABLET | Freq: Every day | ORAL | 1 refills | Status: DC
Start: 2023-05-31 — End: 2023-05-31

## 2023-05-31 NOTE — Progress Notes (Signed)
Date:  05/31/2023   Name:  Jason [name]   DOB:  [birth year]   MRN:  [id number]   Chief Complaint: Hypertension and Asthma  Hypertension This is a chronic problem. The current episode started more than 1 year ago. The problem has been gradually improving since onset. The problem is controlled. Pertinent negatives include no anxiety, blurred vision, chest pain, headaches, malaise/fatigue, neck pain, orthopnea, palpitations, peripheral edema, PND, shortness of breath or sweats. There are no associated agents to hypertension. Past treatments include beta blockers, alpha 1 blockers and calcium channel blockers. The current treatment provides moderate improvement. There are no compliance problems.  There is no history of CAD/MI or CVA. There is no history of chronic renal disease, a hypertension causing med or renovascular disease.  Asthma There is no chest tightness, cough, difficulty breathing, frequent throat clearing, hemoptysis, hoarse voice, shortness of breath, sputum production or wheezing. The current episode started in the past 7 days. The problem occurs intermittently. The problem has been waxing and waning. Associated symptoms include dyspnea on exertion and postnasal drip. Pertinent negatives include no chest pain, fever, headaches, malaise/fatigue, nasal congestion, PND, rhinorrhea, sneezing, sore throat, sweats, trouble swallowing or weight loss. His symptoms are alleviated by beta-agonist and steroid inhaler. He reports moderate improvement on treatment. His past medical history is significant for asthma.    Lab Results  Component Value Date   NA 138 12/09/2022   K 3.9 12/09/2022   CO2 23 12/09/2022   GLUCOSE 95 12/09/2022   BUN 14 12/09/2022   CREATININE 0.84 12/09/2022   CALCIUM 9.2 12/09/2022   EGFR 96 12/09/2022   GFRNONAA >60 07/21/2020   Lab Results  Component Value Date   CHOL 145 05/27/2022   HDL 38 (L) 05/27/2022   LDLCALC 93 05/27/2022   TRIG 73 05/27/2022    CHOLHDL 3.5 05/25/2017   Lab Results  Component Value Date   TSH 1.380 05/25/2017   Lab Results  Component Value Date   HGBA1C 4.8 05/27/2022   Lab Results  Component Value Date   WBC 4.8 09/01/2021   HGB 15.1 09/01/2021   HCT 44.6 09/01/2021   MCV 92 09/01/2021   PLT 209 09/01/2021   Lab Results  Component Value Date   ALT 34 05/27/2022   AST 21 05/27/2022   ALKPHOS 73 05/27/2022   BILITOT 0.6 05/27/2022   No results found for: "25OHVITD2", "25OHVITD3", "VD25OH"   Review of Systems  Constitutional:  Negative for fever, malaise/fatigue and weight loss.  HENT:  Positive for postnasal drip. Negative for hoarse voice, rhinorrhea, sneezing, sore throat and trouble swallowing.   Eyes:  Negative for blurred vision.  Respiratory:  Negative for cough, hemoptysis, sputum production, chest tightness, shortness of breath and wheezing.   Cardiovascular:  Positive for dyspnea on exertion. Negative for chest pain, palpitations, orthopnea and PND.  Gastrointestinal:  Negative for abdominal pain.  Endocrine: Negative for polydipsia and polyuria.  Genitourinary:  Negative for difficulty urinating.  Musculoskeletal:  Negative for neck pain.  Neurological:  Negative for headaches.    Patient Active Problem List   Diagnosis Date Noted   Colon cancer screening    Non-recurrent bilateral inguinal hernia without obstruction or gangrene    Glaucoma 07/18/2020   Age-related cataract of both eyes 07/18/2020   Essential hypertension 05/25/2017   Umbilical hernia without obstruction and without gangrene 05/25/2017   Exertional chest pain 05/25/2017   Cervical radiculopathy 05/25/2017   FH: diabetes mellitus 05/25/2017    No  Active Allergies  Past Surgical History:  Procedure Laterality Date   COLONOSCOPY WITH PROPOFOL N/A 08/10/2021   Procedure: COLONOSCOPY WITH PROPOFOL;  Surgeon: Midge Minium, MD;  Location: [facility];  Service: Endoscopy;  Laterality: N/A;   SHOULDER  ARTHROSCOPY W/ ROTATOR CUFF REPAIR Left 2014   TRABECULECTOMY Left    UMBILICAL HERNIA REPAIR N/A 07/24/2020   Procedure: HERNIA REPAIR UMBILICAL ADULT, open;  Surgeon: Henrene Dodge, MD;  Location: [facility];  Service: General;  Laterality: N/A;   UPPER GI ENDOSCOPY     XI ROBOTIC ASSISTED INGUINAL HERNIA REPAIR WITH MESH Left 07/24/2020   Procedure: XI ROBOTIC ASSISTED INGUINAL HERNIA REPAIR WITH MESH;  Surgeon: Henrene Dodge, MD;  Location: [facility];  Service: General;  Laterality: Left;    Social History   Tobacco Use   Smoking status: Never   Smokeless tobacco: Never  Vaping Use   Vaping status: Never Used  Substance Use Topics   Alcohol use: Yes    Alcohol/week: 10.0 standard drinks of alcohol    Types: 10 Cans of beer per week   Drug use: Yes    Types: Marijuana    Comment: Occasionally for glaucoma     Medication list has been reviewed and updated.  Current Meds  Medication Sig   acetaminophen (TYLENOL) 500 MG tablet Take 2 tablets (1,000 mg total) by mouth every 6 (six) hours as needed for mild pain.   albuterol (VENTOLIN HFA) 108 (90 Base) MCG/ACT inhaler Inhale 2 puffs into the lungs every 6 (six) hours as needed for wheezing or shortness of breath.   amLODipine (NORVASC) 5 MG tablet Take 1 tablet (5 mg total) by mouth daily.   Ascorbic Acid (VITAMIN C) 1000 MG tablet Take 1,000 mg by mouth daily.   budesonide-formoterol (SYMBICORT) 80-4.5 MCG/ACT inhaler Inhale 2 puffs into the lungs twice a day   cholecalciferol (VITAMIN D3) 25 MCG (1000 UNIT) tablet Take 1,000 Units by mouth daily.   Cod Liver Oil 1000 MG CAPS Take 1,000 mg by mouth daily.   dorzolamide-timolol (COSOPT) 2-0.5 % ophthalmic solution Administer 1 drop to both eyes two (2) times a day.   doxazosin (CARDURA) 1 MG tablet TAKE 1 TABLET BY MOUTH EVERY DAY   ibuprofen (ADVIL) 600 MG tablet Take 1 tablet (600 mg total) by mouth every 8 (eight) hours as needed for moderate pain.   Misc Natural Products  (HORNY GOAT WEED PO) Take by mouth.   Multiple Vitamins-Minerals (MULTIVITAMIN MEN PO) Take by mouth. Mega Man   valsartan-hydrochlorothiazide (DIOVAN-HCT) 160-25 MG tablet TAKE 1 TABLET BY MOUTH EVERY DAY   [DISCONTINUED] metoprolol tartrate (LOPRESSOR) 50 MG tablet Take 1 tablet (50 mg total) by mouth once for 1 dose.       05/31/2023    1:48 PM 09/13/2022    8:00 AM 09/06/2022    9:50 AM 07/06/2022   10:37 AM  GAD 7 : Generalized Anxiety Score  Nervous, Anxious, on Edge 0 0 0 0  Control/stop worrying 0 0 0 0  Worry too much - different things 0 0 0 0  Trouble relaxing 0 0 0 0  Restless 0 0 0 0  Easily annoyed or irritable 0 0 0 0  Afraid - awful might happen 0 0 0 0  Total GAD 7 Score 0 0 0 0  Anxiety Difficulty Not difficult at all Not difficult at all Not difficult at all Not difficult at all       05/31/2023    1:48  PM 09/13/2022    8:00 AM 09/06/2022    9:50 AM  Depression screen PHQ 2/9  Decreased Interest 0 0 0  Down, Depressed, Hopeless 0 0 0  PHQ - 2 Score 0 0 0  Altered sleeping 0 0 0  Tired, decreased energy 0 0 0  Change in appetite 0 0 0  Feeling bad or failure about yourself  0 0 0  Trouble concentrating 0 0 0  Moving slowly or fidgety/restless 0 0 0  Suicidal thoughts 0 0 0  PHQ-9 Score 0 0 0  Difficult doing work/chores Not difficult at all Not difficult at all Not difficult at all    BP Readings from Last 3 Encounters:  05/31/23 128/78  01/06/23 106/75  12/09/22 120/80    Physical Exam Vitals and nursing note reviewed.  Constitutional:      Appearance: He is well-developed.  HENT:     Head: Normocephalic and atraumatic.     Right Ear: Tympanic membrane, ear canal and external ear normal.     Left Ear: Tympanic membrane, ear canal and external ear normal.     Nose: Nose normal. No rhinorrhea.     Mouth/Throat:     Mouth: Mucous membranes are moist.     Dentition: Normal dentition.  Eyes:     General: Lids are normal. No scleral icterus.     Conjunctiva/sclera: Conjunctivae normal.     Pupils: Pupils are equal, round, and reactive to light.  Neck:     Thyroid: No thyromegaly.     Vascular: No carotid bruit, hepatojugular reflux or JVD.     Trachea: No tracheal deviation.  Cardiovascular:     Rate and Rhythm: Normal rate and regular rhythm.     Heart sounds: Normal heart sounds. No murmur heard.    No gallop.  Pulmonary:     Effort: Pulmonary effort is normal.     Breath sounds: Normal breath sounds. No wheezing, rhonchi or rales.  Abdominal:     General: Bowel sounds are normal.     Palpations: Abdomen is soft. There is no hepatomegaly, splenomegaly or mass.     Tenderness: There is no abdominal tenderness.     Hernia: There is no hernia in the left inguinal area.  Musculoskeletal:        General: Normal range of motion.     Cervical back: Normal range of motion and neck supple.  Lymphadenopathy:     Cervical: No cervical adenopathy.  Skin:    General: Skin is warm and dry.     Findings: No rash.  Neurological:     Mental Status: He is alert and oriented to person, place, and time.     Sensory: No sensory deficit.     Deep Tendon Reflexes: Reflexes are normal and symmetric.  Psychiatric:        Mood and Affect: Mood is not anxious or depressed.     Wt Readings from Last 3 Encounters:  05/31/23 189 lb (85.7 kg)  12/09/22 188 lb 8 oz (85.5 kg)  09/13/22 188 lb (85.3 kg)    BP 128/78   Pulse 80   Ht 5\' 7"  (1.702 m)   Wt 189 lb (85.7 kg)   SpO2 95%   BMI 29.60 kg/m   Assessment and Plan: 1. Essential hypertension (Primary) Chronic.  Controlled.  Stable.  Blood pressure today is 128/78.  Asymptomatic.  Tolerating medications well.  Continue amlodipine 5 mg once a day and Cardura milligram twice a day.  Will recheck in 6 months.  Review of previous basic metabolic panel is in normal limits and we will forego renal function panel.  Patient will be rechecked in 6 months as mentioned - amLODipine (NORVASC) 5 MG  tablet; Take 1 tablet (5 mg total) by mouth daily.  Dispense: 90 tablet; Refill: 1 - doxazosin (CARDURA) 1 MG tablet; Take 1 tablet (1 mg total) by mouth daily.  Dispense: 90 tablet; Refill: 1  2. Mild intermittent reactive airway disease without complication Chronic will refill albuterol 2 puffs every 6 hours.  Controlled.  Stable.  Patient's been intermittently taking.  Therefore he has intermittently had some flares but currently is stable without wheezing rales or rhonchi's or rub.  Currently is stable with Symbicort with several refills. - albuterol (VENTOLIN HFA) 108 (90 Base) MCG/ACT inhaler; Inhale 2 puffs into the lungs every 6 (six) hours as needed for wheezing or shortness of breath.  Dispense: 18 g; Refill: 2  3. Benign prostatic hyperplasia with urinary hesitancy Chronic.  Episodic.  Stable.  Controlled with  Cardura 1 1 mg twice a day - doxazosin (CARDURA) 1 MG tablet; Take 1 tablet (1 mg total) by mouth daily.  Dispense: 90 tablet; Refill: 1     Elizabeth Sauer, MD

## 2023-07-13 ENCOUNTER — Ambulatory Visit: Payer: Self-pay | Admitting: Family Medicine

## 2023-07-13 NOTE — Telephone Encounter (Signed)
  Chief Complaint: left ankle/foot injury and pain Symptoms: left ankle/arch of foot pain radiates up his shin Frequency: injury occurred 2 weeks ago Pertinent Negatives: Patient denies swelling, bruising, redness, numbness or tingling in left leg/foot  Disposition: [] ED /[] Urgent Care (no appt availability in office) / [x] Appointment(In office/virtual)/ []  [facility]/ [] Home Care/ [] Refused Recommended Disposition /[] [facility]/ []  Follow-up with PCP Additional Notes: Patient had twisting injury to left ankle about 2 weeks ago while walking in the snow. He states he has been taking naproxen for pain and icing it. Patient states the pain is still moderate. Scheduled with PCP for acute appointment tomorrow.  Copied from CRM [id number]. Topic: Clinical - Red Word Triage >> Jul 13, 2023  2:58 PM Albin Felling L wrote: Red Word that prompted transfer to Nurse Triage: Twisted ankle about 2 weeks ago. Still having pain, rating pain at a 7. Reason for Disposition  [1] Limp when walking AND [2] due to a twisted ankle or foot  Answer Assessment - Initial Assessment Questions 1. MECHANISM: "How did the injury happen?" (e.g., twisting injury, direct blow)      When it snowed 2 weeks ago, patient states his foot got caught and twisted.  2. ONSET: "When did the injury happen?" (Minutes or hours ago)      2 weeks ago.  3. LOCATION: "Where is the injury located?"      Left ankle and left foot (arch of the foot and near his big toe) and shoots halfway up the left shin.  4. APPEARANCE of INJURY: "What does the injury look like?"      Appear normal.  5. WEIGHT-BEARING: "Can you put weight on that foot?" "Can you walk (four steps or more)?"       Yes, states he can bear weight but feels the pain.  6. SIZE: For cuts, bruises, or swelling, ask: "How large is it?" (e.g., inches or centimeters;  entire joint)      Denies.  7. PAIN: "Is there pain?" If Yes, ask: "How bad is the pain?"     (e.g., Scale 1-10; or mild, moderate, severe)   - NONE (0): no pain.   - MILD (1-3): doesn't interfere with normal activities.    - MODERATE (4-7): interferes with normal activities (e.g., work or school) or awakens from sleep, limping.    - SEVERE (8-10): excruciating pain, unable to do any normal activities, unable to walk.      7/10, "feels like a nerve or blood vessel, I can follow the pain up the shin"  8. TETANUS: For any breaks in the skin, ask: "When was the last tetanus booster?"     Denies.  9. OTHER SYMPTOMS: "Do you have any other symptoms?"      Denies.  Protocols used: Ankle and Foot Injury-A-AH

## 2023-07-14 ENCOUNTER — Encounter: Payer: Self-pay | Admitting: Family Medicine

## 2023-07-14 ENCOUNTER — Ambulatory Visit: Admitting: Family Medicine

## 2023-07-14 ENCOUNTER — Ambulatory Visit
Admission: RE | Admit: 2023-07-14 | Discharge: 2023-07-14 | Disposition: A | Discharge status: Home / Self Care | Source: Ambulatory Visit | Attending: Family Medicine | Admitting: Family Medicine

## 2023-07-14 ENCOUNTER — Ambulatory Visit
Admission: RE | Admit: 2023-07-14 | Discharge: 2023-07-14 | Disposition: A | Discharge status: Home / Self Care | Attending: Family Medicine | Admitting: Family Medicine

## 2023-07-14 VITALS — BP 122/68 | HR 66 | Ht 67.0 in | Wt 185.0 lb

## 2023-07-14 DIAGNOSIS — S8265XA Nondisplaced fracture of lateral malleolus of left fibula, initial encounter for closed fracture: Secondary | ICD-10-CM | POA: Diagnosis not present

## 2023-07-14 DIAGNOSIS — S93432A Sprain of tibiofibular ligament of left ankle, initial encounter: Secondary | ICD-10-CM

## 2023-07-14 DIAGNOSIS — S82402A Unspecified fracture of shaft of left fibula, initial encounter for closed fracture: Secondary | ICD-10-CM | POA: Diagnosis not present

## 2023-07-14 DIAGNOSIS — S93402A Sprain of unspecified ligament of left ankle, initial encounter: Secondary | ICD-10-CM | POA: Diagnosis not present

## 2023-07-14 NOTE — Progress Notes (Signed)
 Date:  07/14/2023   Name:  Jason [name]   DOB:  05-10-1956   MRN:  [id number]   Chief Complaint: Ankle Pain (Left ankle and foot pain x 2 weeks. Patient said he twisted his ankle in the snow and his foot has been hurting since then.)  Ankle Pain  The incident occurred more than 1 week ago. Incident location: walking to the store. The injury mechanism was a fall. The pain is present in the left ankle. The quality of the pain is described as aching. The pain is moderate. The pain has been Fluctuating since onset. Associated symptoms include an inability to bear weight. Pertinent negatives include no loss of motion, loss of sensation, muscle weakness or tingling. He reports no foreign bodies present. Nothing aggravates the symptoms. He has tried nothing for the symptoms. The treatment provided mild relief.    Lab Results  Component Value Date   NA 138 12/09/2022   K 3.9 12/09/2022   CO2 23 12/09/2022   GLUCOSE 95 12/09/2022   BUN 14 12/09/2022   CREATININE 0.84 12/09/2022   CALCIUM 9.2 12/09/2022   EGFR 96 12/09/2022   GFRNONAA >60 07/21/2020   Lab Results  Component Value Date   CHOL 145 05/27/2022   HDL 38 (L) 05/27/2022   LDLCALC 93 05/27/2022   TRIG 73 05/27/2022   CHOLHDL 3.5 05/25/2017   Lab Results  Component Value Date   TSH 1.380 05/25/2017   Lab Results  Component Value Date   HGBA1C 4.8 05/27/2022   Lab Results  Component Value Date   WBC 4.8 09/01/2021   HGB 15.1 09/01/2021   HCT 44.6 09/01/2021   MCV 92 09/01/2021   PLT 209 09/01/2021   Lab Results  Component Value Date   ALT 34 05/27/2022   AST 21 05/27/2022   ALKPHOS 73 05/27/2022   BILITOT 0.6 05/27/2022   No results found for: "25OHVITD2", "25OHVITD3", "VD25OH"   Review of Systems  Constitutional:  Negative for chills and fever.  HENT:  Negative for trouble swallowing.   Eyes:  Negative for visual disturbance.  Respiratory:  Negative for chest tightness and shortness of breath.    Cardiovascular:  Negative for chest pain and palpitations.  Gastrointestinal:  Negative for abdominal pain and nausea.  Endocrine: Negative for polydipsia and polyuria.  Genitourinary:  Negative for difficulty urinating.  Musculoskeletal:  Negative for arthralgias.  Neurological:  Negative for tingling.    Patient Active Problem List   Diagnosis Date Noted   Colon cancer screening    Non-recurrent bilateral inguinal hernia without obstruction or gangrene    Glaucoma 07/18/2020   Age-related cataract of both eyes 07/18/2020   Essential hypertension 05/25/2017   Umbilical hernia without obstruction and without gangrene 05/25/2017   Exertional chest pain 05/25/2017   Cervical radiculopathy 05/25/2017   FH: diabetes mellitus 05/25/2017    No Active Allergies  Past Surgical History:  Procedure Laterality Date   COLONOSCOPY WITH PROPOFOL N/A 08/10/2021   Procedure: COLONOSCOPY WITH PROPOFOL;  Surgeon: Midge Minium, MD;  Location: [facility];  Service: Endoscopy;  Laterality: N/A;   SHOULDER ARTHROSCOPY W/ ROTATOR CUFF REPAIR Left 2014   TRABECULECTOMY Left    UMBILICAL HERNIA REPAIR N/A 07/24/2020   Procedure: HERNIA REPAIR UMBILICAL ADULT, open;  Surgeon: Henrene Dodge, MD;  Location: [facility];  Service: General;  Laterality: N/A;   UPPER GI ENDOSCOPY     XI ROBOTIC ASSISTED INGUINAL HERNIA REPAIR WITH MESH Left 07/24/2020   Procedure:  XI ROBOTIC ASSISTED INGUINAL HERNIA REPAIR WITH MESH;  Surgeon: Henrene Dodge, MD;  Location: [facility];  Service: General;  Laterality: Left;    Social History   Tobacco Use   Smoking status: Never   Smokeless tobacco: Never  Vaping Use   Vaping status: Never Used  Substance Use Topics   Alcohol use: Yes    Alcohol/week: 10.0 standard drinks of alcohol    Types: 10 Cans of beer per week   Drug use: Yes    Types: Marijuana    Comment: Occasionally for glaucoma     Medication list has been reviewed and updated.  Current Meds   Medication Sig   acetaminophen (TYLENOL) 500 MG tablet Take 2 tablets (1,000 mg total) by mouth every 6 (six) hours as needed for mild pain.   albuterol (VENTOLIN HFA) 108 (90 Base) MCG/ACT inhaler Inhale 2 puffs into the lungs every 6 (six) hours as needed for wheezing or shortness of breath.   amLODipine (NORVASC) 5 MG tablet Take 1 tablet (5 mg total) by mouth daily.   Ascorbic Acid (VITAMIN C) 1000 MG tablet Take 1,000 mg by mouth daily.   budesonide-formoterol (SYMBICORT) 80-4.5 MCG/ACT inhaler Inhale 2 puffs into the lungs twice a day   cholecalciferol (VITAMIN D3) 25 MCG (1000 UNIT) tablet Take 1,000 Units by mouth daily.   Cod Liver Oil 1000 MG CAPS Take 1,000 mg by mouth daily.   dorzolamide-timolol (COSOPT) 2-0.5 % ophthalmic solution Administer 1 drop to both eyes two (2) times a day.   doxazosin (CARDURA) 1 MG tablet Take 1 tablet (1 mg total) by mouth daily.   ibuprofen (ADVIL) 600 MG tablet Take 1 tablet (600 mg total) by mouth every 8 (eight) hours as needed for moderate pain.   Misc Natural Products (HORNY GOAT WEED PO) Take by mouth.   Multiple Vitamins-Minerals (MULTIVITAMIN MEN PO) Take by mouth. Mega Man   valsartan-hydrochlorothiazide (DIOVAN-HCT) 160-25 MG tablet TAKE 1 TABLET BY MOUTH EVERY DAY       05/31/2023    1:48 PM 09/13/2022    8:00 AM 09/06/2022    9:50 AM 07/06/2022   10:37 AM  GAD 7 : Generalized Anxiety Score  Nervous, Anxious, on Edge 0 0 0 0  Control/stop worrying 0 0 0 0  Worry too much - different things 0 0 0 0  Trouble relaxing 0 0 0 0  Restless 0 0 0 0  Easily annoyed or irritable 0 0 0 0  Afraid - awful might happen 0 0 0 0  Total GAD 7 Score 0 0 0 0  Anxiety Difficulty Not difficult at all Not difficult at all Not difficult at all Not difficult at all       05/31/2023    1:48 PM 09/13/2022    8:00 AM 09/06/2022    9:50 AM  Depression screen PHQ 2/9  Decreased Interest 0 0 0  Down, Depressed, Hopeless 0 0 0  PHQ - 2 Score 0 0 0  Altered  sleeping 0 0 0  Tired, decreased energy 0 0 0  Change in appetite 0 0 0  Feeling bad or failure about yourself  0 0 0  Trouble concentrating 0 0 0  Moving slowly or fidgety/restless 0 0 0  Suicidal thoughts 0 0 0  PHQ-9 Score 0 0 0  Difficult doing work/chores Not difficult at all Not difficult at all Not difficult at all    BP Readings from Last 3 Encounters:  07/14/23 122/68  05/31/23 128/78  01/06/23 106/75    Physical Exam Vitals and nursing note reviewed.  HENT:     Right Ear: Tympanic membrane normal.     Left Ear: Tympanic membrane normal.     Nose: Nose normal.     Mouth/Throat:     Mouth: Mucous membranes are moist.  Eyes:     Pupils: Pupils are equal, round, and reactive to light.  Cardiovascular:     Rate and Rhythm: Normal rate and regular rhythm.     Heart sounds: No murmur heard.    No friction rub. No gallop.  Pulmonary:     Breath sounds: No wheezing, rhonchi or rales.  Abdominal:     Palpations: There is no mass.     Tenderness: There is no abdominal tenderness. There is no guarding or rebound.     Hernia: No hernia is present.  Musculoskeletal:     Left ankle: Ecchymosis present. Tenderness present over the lateral malleolus, ATF ligament, CF ligament and posterior TF ligament. No medial malleolus, base of 5th metatarsal or proximal fibula tenderness. Normal range of motion. Normal pulse.  Neurological:     Mental Status: He is alert.    Wt Readings from Last 3 Encounters:  07/14/23 185 lb (83.9 kg)  05/31/23 189 lb (85.7 kg)  12/09/22 188 lb 8 oz (85.5 kg)    BP 122/68   Pulse 66   Ht 5\' 7"  (1.702 m)   Wt 185 lb (83.9 kg)   SpO2 99%   BMI 28.98 kg/m   Assessment and Plan: 1. Sprain of tibiofibular ligament of left ankle, initial encounter (Primary) New onset.  Persistent.  Gradually improving.  Patient continues to have lateral pain particularly over the anterior talofibular ligament.  Will obtain x-ray of ankle and that been 2 weeks and  there is difficulty bearing weight.  In the meantime we will continue with current over-the-counter therapy.  Initial look at the x-ray but still waiting for pending radiology report notes no fracture of the distal fibula. - DG Ankle Complete Left; Future     Elizabeth Sauer, MD

## 2023-07-15 ENCOUNTER — Encounter: Payer: Self-pay | Admitting: Family Medicine

## 2023-07-15 ENCOUNTER — Ambulatory Visit: Admitting: Family Medicine

## 2023-07-15 VITALS — BP 124/76 | HR 60 | Ht 67.0 in | Wt 185.0 lb

## 2023-07-15 DIAGNOSIS — S82892A Other fracture of left lower leg, initial encounter for closed fracture: Secondary | ICD-10-CM | POA: Diagnosis not present

## 2023-07-15 MED ORDER — VITAMIN D (ERGOCALCIFEROL) 1.25 MG (50000 UNIT) PO CAPS
50000.0000 [IU] | ORAL_CAPSULE | ORAL | 0 refills | Status: DC
Start: 2023-07-15 — End: 2023-10-06

## 2023-07-20 DIAGNOSIS — S82892A Other fracture of left lower leg, initial encounter for closed fracture: Secondary | ICD-10-CM | POA: Insufficient documentation

## 2023-07-20 NOTE — Patient Instructions (Signed)
 Patient Care Plan  Left Ankle Fracture  1. Wear the prescribed cam boot all day, except when sleeping or bathing. 2. Elevate your ankle above heart level whenever possible. 3. Use ice and take acetaminophen as needed for pain relief. 4. Take vitamin D 50,000 IU once a week for 8 weeks. 5. Take calcium 2000 mg daily. 6. Begin gentle ankle exercises in 2 weeks (instructions will be provided). 7. Schedule a follow-up appointment in 6 weeks, including x-rays.  Ankle Arthritis  1. Monitor your symptoms and manage any exacerbations as they occur.  Red Flags to Watch For - If you experience increased swelling, severe pain, or any new symptoms, contact your healthcare provider immediately.  Please follow these steps and reach out if you have any questions or concerns about your treatment plan.

## 2023-07-20 NOTE — Assessment & Plan Note (Addendum)
 History of Present Illness The patient presents with left ankle pain following an injury. He was referred by Dr. Rochelle Chu for evaluation of his ankle injury.  Two weeks ago, he turned his ankle while walking in the snow, resulting in a ninety-degree twist and immediate burning pain. He initially applied ice at home for one to two weeks, which helped with swelling, but persistent pain led him to suspect it was more than a sprain.  He describes tenderness in the left outer ankle, particularly in the deltoid ligament area and at the bottom of the fibula bone. He also notes new soreness at the front of the shin bone, which was not present before the injury. No swelling in the ankle after the initial injury, and no pain when pressure is applied to certain areas of the ankle, except for specific tender spots.  He has been using topical treatments like Icy Hot and Biofreeze and has been massaging the area daily. Despite these efforts, he has been walking on the injured ankle, initially thinking it was just a sprain.  He is currently taking a prescription strength vitamin D  once weekly and plans to take calcium 2000 mg daily.  Physical Exam PALPATION: Tenderness at the distal end of the left fibula, minimal tenderness at CFL, tenderness at PTFL, soreness at anterior tibia, and tenderness at the deltoid ligament. No tenderness at ATFL or the base of the fifth metatarsal.  Results RADIOLOGY Ankle X-ray: Small transverse fracture in lateral malleolus, small anterior tibial fracture, possible small avulsion fracture, mild osteoarthritis in joint (07/15/2023)  Assessment and Plan Left ankle fracture Stable fracture at lateral ankle with possible anterior tibial fracture. Healing expected in 6-8 weeks clinically, 2-3 months radiographically. - Prescribe cam boot for immobilization. - Advise wearing boot all day except sleeping or bathing. - Recommend ankle elevation above heart level. - Allow ice and  acetaminophen  for pain. - Prescribe vitamin D  50,000 IU weekly for 8 weeks. - Advise calcium 2000 mg daily. - Provide gentle exercise instructions in 2 weeks. - Schedule follow-up in 6 weeks with x-rays.  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 - Monitor symptoms and address exacerbations.

## 2023-07-20 NOTE — Progress Notes (Signed)
 Primary Care / Sports Medicine Office Visit  Patient Information:  Patient ID: Jason [name], male DOB: 05/14/1955 Age: 68 y.o. MRN: [id number]   Jason [name] is a pleasant 68 y.o. male presenting with the following:  Chief Complaint  Patient presents with   Ankle Injury    Patient had a twisted his left ankle walking to the store in the snow a few weeks ago 06/29/23. Patient had xray on 07/14/23 he was diagnosed with a sprain of tibiofibular ligament of left ankle.     Vitals:   07/15/23 1400  BP: 124/76  Pulse: 60  SpO2: 98%   Vitals:   07/15/23 1400  Weight: 185 lb (83.9 kg)  Height: 5\' 7"  (1.702 m)   Body mass index is 28.98 kg/m.     Independent interpretation of notes and tests performed by another provider:   See below  Procedures performed:   None  Pertinent History, Exam, Impression, and Recommendations:   Problem List Items Addressed This Visit     Closed fracture of left ankle - Primary   History of Present Illness The patient presents with left ankle pain following an injury. He was referred by Dr. Yetta Barre for evaluation of his ankle injury.  Two weeks ago, he turned his ankle while walking in the snow, resulting in a ninety-degree twist and immediate burning pain. He initially applied ice at home for one to two weeks, which helped with swelling, but persistent pain led him to suspect it was more than a sprain.  He describes tenderness in the left outer ankle, particularly in the deltoid ligament area and at the bottom of the fibula bone. He also notes new soreness at the front of the shin bone, which was not present before the injury. No swelling in the ankle after the initial injury, and no pain when pressure is applied to certain areas of the ankle, except for specific tender spots.  He has been using topical treatments like Icy Hot and Biofreeze and has been massaging the area daily. Despite these efforts, he has been walking on the injured ankle,  initially thinking it was just a sprain.  He is currently taking a prescription strength vitamin D once weekly and plans to take calcium 2000 mg daily.  Physical Exam PALPATION: Tenderness at the distal end of the left fibula, minimal tenderness at CFL, tenderness at PTFL, soreness at anterior tibia, and tenderness at the deltoid ligament. No tenderness at ATFL or the base of the fifth metatarsal.  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 Healing expected in 6-8 weeks clinically, 2-3 months radiographically. - Prescribe cam boot for immobilization. - Advise wearing boot all day except sleeping or bathing. - Recommend ankle elevation above heart level. - Allow ice and acetaminophen for pain. - Prescribe vitamin D 50,000 IU weekly for 8 weeks. - Advise calcium 2000 mg daily. - Provide gentle exercise instructions in 2 weeks. - Schedule follow-up in 6 weeks with x-rays.  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 - Monitor symptoms and address exacerbations.      Relevant Medications   Vitamin D, Ergocalciferol, (DRISDOL) 1.25 MG (50000 UNIT) CAPS capsule   Other Relevant Orders   DG Ankle Complete Left     Orders & Medications Medications:  Meds ordered this  encounter  Medications   Vitamin D, Ergocalciferol, (DRISDOL) 1.25 MG (50000 UNIT) CAPS capsule    Sig: Take 1 capsule (50,000 Units total) by mouth every 7 (seven) days. Take for 8 total doses(weeks)    Dispense:  8 capsule    Refill:  0   Orders Placed This Encounter  Procedures   DG Ankle Complete Left     Return in about 6 weeks (around 08/26/2023) for f/u left ankle fracture, needs XR prior.     Jerrol Banana, MD, [facility]   Primary Care  Sports Medicine Primary Care and Sports Medicine at [facility]

## 2023-08-10 ENCOUNTER — Other Ambulatory Visit: Payer: Self-pay | Admitting: Family Medicine

## 2023-08-10 DIAGNOSIS — I10 Essential (primary) hypertension: Secondary | ICD-10-CM

## 2023-08-24 ENCOUNTER — Other Ambulatory Visit: Payer: Self-pay | Admitting: Family Medicine

## 2023-08-24 DIAGNOSIS — I10 Essential (primary) hypertension: Secondary | ICD-10-CM

## 2023-08-26 ENCOUNTER — Encounter: Payer: Self-pay | Admitting: Family Medicine

## 2023-08-26 ENCOUNTER — Ambulatory Visit
Admission: RE | Admit: 2023-08-26 | Discharge: 2023-08-26 | Disposition: A | Discharge status: Home / Self Care | Source: Ambulatory Visit | Attending: Family Medicine | Admitting: Family Medicine

## 2023-08-26 ENCOUNTER — Ambulatory Visit
Admission: RE | Admit: 2023-08-26 | Discharge: 2023-08-26 | Disposition: A | Discharge status: Home / Self Care | Attending: Family Medicine | Admitting: Family Medicine

## 2023-08-26 ENCOUNTER — Ambulatory Visit (INDEPENDENT_AMBULATORY_CARE_PROVIDER_SITE_OTHER): Admitting: Family Medicine

## 2023-08-26 VITALS — BP 130/80 | HR 75 | Ht 67.0 in | Wt 183.0 lb

## 2023-08-26 DIAGNOSIS — S8265XD Nondisplaced fracture of lateral malleolus of left fibula, subsequent encounter for closed fracture with routine healing: Secondary | ICD-10-CM | POA: Diagnosis not present

## 2023-08-26 DIAGNOSIS — S82402A Unspecified fracture of shaft of left fibula, initial encounter for closed fracture: Secondary | ICD-10-CM | POA: Diagnosis not present

## 2023-08-26 DIAGNOSIS — S82892A Other fracture of left lower leg, initial encounter for closed fracture: Secondary | ICD-10-CM | POA: Diagnosis not present

## 2023-08-26 DIAGNOSIS — S8265XA Nondisplaced fracture of lateral malleolus of left fibula, initial encounter for closed fracture: Secondary | ICD-10-CM | POA: Insufficient documentation

## 2023-08-26 DIAGNOSIS — M25472 Effusion, left ankle: Secondary | ICD-10-CM | POA: Diagnosis not present

## 2023-08-26 NOTE — Progress Notes (Signed)
 Primary Care / Sports Medicine Office Visit  Patient Information:  Patient ID: Jason [name], male DOB: March 05, 1956 Age: 68 y.o. MRN: [id number]   Shayon Trompeter is a pleasant 68 y.o. male presenting with the following:  Chief Complaint  Patient presents with   Ankle Injury    Patient presents today to see how his left ankle fracture is healing. He is in his CAM boot today and states his ankle is feeling a little better.     Vitals:   08/26/23 1117  BP: 130/80  Pulse: 75  SpO2: 97%   Vitals:   08/26/23 1117  Weight: 183 lb (83 kg)  Height: 5' 7 (1.702 m)   Body mass index is 28.66 kg/m.  No results found.   Independent interpretation of notes and tests performed by another provider:   See below  Procedures performed:   None  Pertinent History, Exam, Impression, and Recommendations:   Problem List Items Addressed This Visit     Closed nondisplaced fracture of lateral malleolus of left fibula - Primary   History of Present Illness Jason [name] is a 68 year old male who presents for follow-up of a left ankle fracture.  He continues to experience pain, improved, on the lateral aspect of the left ankle, describing it as discomfort at the back of the ankle. He walks gingerly and has been using CAM boot for support. He has been able to walk without the boot at home, particularly when getting ready for bed or going to the bathroom, but still experiences some pain.  He has been taking previously prescribed vitamin D  supplements and has a few more weeks' supply remaining. Pain does not wake him at night, but he needs to adjust his sleeping position to avoid discomfort.  He has not experienced any significant swelling recently, and updated x-rays were obtained today. No significant swelling and pain does not wake him at night.  Physical Exam INSPECTION: Interval reduction in swelling, no ecchymosis. PALPATION: Non-tender to palpation lateral malleolus, tenderness with  maximal percussion at the posteroinferior aspect of the distal left fibula/lateral malleolus, otherwise benign. RANGE OF MOTION: Full active range of motion, mild pain with maximal active inversion, localizing laterally.  Results RADIOLOGY Ankle X-ray: Stable transverse lateral malleolus fracture, no interval displacement, interval reduction in s soft tissue shadow swelling, previously noted anterior joint radiopaque osseous spur resolved/resorbed.(08/26/2023)  Assessment and Plan Left lateral malleolus fracture Healing fracture with expected improved tenderness at distal fibula. X-rays show well-aligned fracture, reduced swelling, anterior joint line reassuring. Mild pain with inversion. Transition to ASO brace recommended for support and muscle strengthening. Physical therapy beneficial for recovery. - Gradually transition from cam boot to lace up ASO brace for ankle support. - Once you are able to tolerate lace up ASO brace at home without issues, then discontinue cam boot outside of the home.  Continue the cam boot outside of the home until then. - Refer to physical therapy for rehabilitation. - Advise on home exercises for muscle strength. - Continue vitamin D  supplementation. - Use acetaminophen  or ibuprofen  for pain as needed. - Apply ice and elevate to reduce swelling. - Follow-up in six weeks with new x-rays.       Relevant Orders   Ambulatory referral to Physical Therapy     Orders & Medications Medications: No orders of the defined types were placed in this encounter.  Orders Placed This Encounter  Procedures   Ambulatory referral to Physical Therapy     No follow-ups  on file.     Selinda JINNY Ku, MD, [facility]   Primary Care Sports Medicine Primary Care and Sports Medicine at [facility]

## 2023-08-26 NOTE — Assessment & Plan Note (Signed)
 History of Present Illness Jason [name] is a 68 year old male who presents for follow-up of a left ankle fracture.  He continues to experience pain, improved, on the lateral aspect of the left ankle, describing it as discomfort at the back of the ankle. He walks gingerly and has been using CAM boot for support. He has been able to walk without the boot at home, particularly when getting ready for bed or going to the bathroom, but still experiences some pain.  He has been taking previously prescribed vitamin D  supplements and has a few more weeks' supply remaining. Pain does not wake him at night, but he needs to adjust his sleeping position to avoid discomfort.  He has not experienced any significant swelling recently, and updated x-rays were obtained today. No significant swelling and pain does not wake him at night.  Physical Exam INSPECTION: Interval reduction in swelling, no ecchymosis. PALPATION: Non-tender to palpation lateral malleolus, tenderness with maximal percussion at the posteroinferior aspect of the distal left fibula/lateral malleolus, otherwise benign. RANGE OF MOTION: Full active range of motion, mild pain with maximal active inversion, localizing laterally.  Results RADIOLOGY Ankle X-ray: Stable transverse lateral malleolus fracture, no interval displacement, interval reduction in s soft tissue shadow swelling, previously noted anterior joint radiopaque osseous spur resolved/resorbed.(08/26/2023)  Assessment and Plan Left lateral malleolus fracture Healing fracture with expected improved tenderness at distal fibula. X-rays show well-aligned fracture, reduced swelling, anterior joint line reassuring. Mild pain with inversion. Transition to ASO brace recommended for support and muscle strengthening. Physical therapy beneficial for recovery. - Gradually transition from cam boot to lace up ASO brace for ankle support. - Once you are able to tolerate lace up ASO brace at home  without issues, then discontinue cam boot outside of the home.  Continue the cam boot outside of the home until then. - Refer to physical therapy for rehabilitation. - Advise on home exercises for muscle strength. - Continue vitamin D  supplementation. - Use acetaminophen  or ibuprofen  for pain as needed. - Apply ice and elevate to reduce swelling. - Follow-up in six weeks with new x-rays.

## 2023-08-26 NOTE — Patient Instructions (Signed)
 Patient Plan for Post-Visit Guidance  1. Ankle Support:    - Gradually transition from the cam boot to a lace-up ASO brace for ankle support.    - Continue using the cam boot outside of the home until you can tolerate the lace-up ASO brace at home without issues.  2. Rehabilitation:    - Start physical therapy for rehabilitation.    - Perform home exercises to strengthen muscles.  3. Pain and Swelling Management:    - Take acetaminophen  or ibuprofen  as needed for pain.    - Apply ice and elevate your ankle to reduce swelling.  4. Nutrition:    - Continue taking vitamin D  supplements.  5. Follow-Up:    - Schedule a follow-up appointment in six weeks with new x-rays to assess healing.  6. Red Flags:    - Contact your healthcare provider if you experience increased pain, swelling, or any new symptoms.  Please follow these steps and reach out if you have any questions or need further assistance.

## 2023-09-08 ENCOUNTER — Encounter: Payer: Medicare Other | Admitting: Family Medicine

## 2023-09-15 ENCOUNTER — Encounter: Payer: Self-pay | Admitting: Family Medicine

## 2023-09-16 ENCOUNTER — Telehealth: Payer: Self-pay | Admitting: Family Medicine

## 2023-09-16 NOTE — Telephone Encounter (Signed)
 Copied from CRM [id number]. Topic: Medicare AWV >> Sep 16, 2023 11:53 AM Juliana Ocean wrote: Reason for CRM: LVM 09/16/2023 to schedule AWV. Please schedule office or virtual visits.  Rosalee Collins; Care Guide Ambulatory Clinical Support  l [facility] Direct Dial: [phone]

## 2023-10-06 ENCOUNTER — Encounter: Payer: Self-pay | Admitting: Family Medicine

## 2023-10-06 ENCOUNTER — Ambulatory Visit
Admission: RE | Admit: 2023-10-06 | Discharge: 2023-10-06 | Disposition: A | Discharge status: Home / Self Care | Source: Ambulatory Visit | Attending: Family Medicine

## 2023-10-06 ENCOUNTER — Ambulatory Visit
Admission: RE | Admit: 2023-10-06 | Discharge: 2023-10-06 | Disposition: A | Discharge status: Home / Self Care | Attending: Family Medicine | Admitting: Family Medicine

## 2023-10-06 ENCOUNTER — Ambulatory Visit (INDEPENDENT_AMBULATORY_CARE_PROVIDER_SITE_OTHER): Admitting: Family Medicine

## 2023-10-06 VITALS — BP 118/78 | HR 60 | Ht 67.0 in | Wt 183.0 lb

## 2023-10-06 DIAGNOSIS — M25472 Effusion, left ankle: Secondary | ICD-10-CM | POA: Diagnosis not present

## 2023-10-06 DIAGNOSIS — S8262XD Displaced fracture of lateral malleolus of left fibula, subsequent encounter for closed fracture with routine healing: Secondary | ICD-10-CM | POA: Diagnosis not present

## 2023-10-06 DIAGNOSIS — S8265XD Nondisplaced fracture of lateral malleolus of left fibula, subsequent encounter for closed fracture with routine healing: Secondary | ICD-10-CM

## 2023-10-06 DIAGNOSIS — S82402D Unspecified fracture of shaft of left fibula, subsequent encounter for closed fracture with routine healing: Secondary | ICD-10-CM | POA: Diagnosis not present

## 2023-10-06 NOTE — Assessment & Plan Note (Signed)
 History of Present Illness Jason [name] is a 68 year old male who presents with persistent,now described as burning, pain in the left ankle.  He experiences persistent burning pain in his ankle, described as 'burning',  similar to the initial injury. The pain has not improved since the last visit, and he has not completed physical therapy or follow-up x-rays.  The burning sensation is accompanied by swelling and tenderness in the ankle. No skin discoloration or numbness is present.  He uses topical treatments such as Aspercreme and Icy Hot for symptom relief. He has not tried Voltaren gel.  He is concerned about the financial implications of physical therapy, as he relies on social security income.  Physical Exam INSPECTION: Swelling at inferior lateral malleolus, no skin discoloration. PALPATION: Tenderness at inferior aspect of lateral malleolus, just posterior. Tenderness to palpation and percussion posteriorly along lateral malleolus and peroneal tendon.  Assessment and Plan Ankle fracture -distal fibular tip Ankle fracture with apparent associated nerve irritation causing burning pain and swelling. We discussed that despite physical therapy, he may have a similar outcome but physical therapy strongly encouraged in an effort to optimize outcome. - Order ankle x-ray to assess healing. - Refer to physical therapy for rehabilitation and nerve decompression. - Recommend Voltaren gel (diclofenac 1%) topically up to four times daily.

## 2023-10-06 NOTE — Progress Notes (Signed)
     Primary Care / Sports Medicine Office Visit  Patient Information:  Patient ID: Jason [name], male DOB: 10-16-1955 Age: 68 y.o. MRN: [id number]   Jason [name] is a pleasant 68 y.o. male presenting with the following:  Chief Complaint  Patient presents with   Ankle Injury    Left ankle has been burning. Patient continues to wear brace. Overall patient does not think his ankle has gotten better.     Vitals:   10/06/23 1100  BP: 118/78  Pulse: 60  SpO2: 97%   Vitals:   10/06/23 1100  Weight: 183 lb (83 kg)  Height: 5\' 7"  (1.702 m)   Body mass index is 28.66 kg/m.  No results found.   Independent interpretation of notes and tests performed by another provider:   None  Procedures performed:   None  Pertinent History, Exam, Impression, and Recommendations:   Problem List Items Addressed This Visit     Closed nondisplaced fracture of lateral malleolus of left fibula - Primary   History of Present Illness Jason [name] is a 68 year old male who presents with persistent,now described as burning, pain in the left ankle.  He experiences persistent burning pain in his ankle, described as 'burning',  similar to the initial injury. The pain has not improved since the last visit, and he has not completed physical therapy or follow-up x-rays.  The burning sensation is accompanied by swelling and tenderness in the ankle. No skin discoloration or numbness is present.  He uses topical treatments such as Aspercreme and Icy Hot for symptom relief. He has not tried Voltaren gel.  He is concerned about the financial implications of physical therapy, as he relies on social security income.  Physical Exam INSPECTION: Swelling at inferior lateral malleolus, no skin discoloration. PALPATION: Tenderness at inferior aspect of lateral malleolus, just posterior. Tenderness to palpation and percussion posteriorly along lateral malleolus and peroneal tendon.  Assessment and Plan Ankle  fracture -distal fibular tip Ankle fracture with apparent associated nerve irritation causing burning pain and swelling. We discussed that despite physical therapy, he may have a similar outcome but physical therapy strongly encouraged in an effort to optimize outcome. - Order ankle x-ray to assess healing. - Refer to physical therapy for rehabilitation and nerve decompression. - Recommend Voltaren gel (diclofenac 1%) topically up to four times daily.      Relevant Orders   DG Ankle Complete Left     Orders & Medications Medications: No orders of the defined types were placed in this encounter.  Orders Placed This Encounter  Procedures   DG Ankle Complete Left     No follow-ups on file.     Ma Saupe, MD, [facility]   Primary Care Sports Medicine Primary Care and Sports Medicine at [facility]

## 2023-10-06 NOTE — Patient Instructions (Signed)
 Patient Action Plan  1. Ankle Fracture:    - Get an ankle x-ray to assess healing progress.    - Start physical therapy to aid rehabilitation and help with nerve decompression.    - Apply Voltaren gel (diclofenac 1%) up to four times daily to reduce pain and swelling.  Red Flags: If you experience increased pain, significant swelling, or any new symptoms such as numbness or tingling, please contact your healthcare provider immediately.

## 2023-10-19 ENCOUNTER — Ambulatory Visit: Payer: Self-pay | Admitting: Family Medicine

## 2023-10-19 NOTE — Progress Notes (Signed)
 Fracture line still visible but is stable (unchanged). Please call [phone] to schedule physical therapy.

## 2023-12-05 ENCOUNTER — Other Ambulatory Visit: Payer: Self-pay | Admitting: Family Medicine

## 2023-12-05 ENCOUNTER — Telehealth: Payer: Self-pay | Admitting: Family Medicine

## 2023-12-05 DIAGNOSIS — N401 Enlarged prostate with lower urinary tract symptoms: Secondary | ICD-10-CM

## 2023-12-05 DIAGNOSIS — I10 Essential (primary) hypertension: Secondary | ICD-10-CM

## 2023-12-05 NOTE — Telephone Encounter (Unsigned)
 Copied from CRM [id number]. Topic: Clinical - Medication Refill >> Dec 05, 2023 11:45 AM Tiffany S wrote: Medication: amLODipine  (NORVASC ) 5 MG tablet [[id number]] valsartan -hydrochlorothiazide  (DIOVAN -HCT) 160-25 MG tablet [[id number]] doxazosin  (CARDURA ) 1 MG tablet [[id number]]  Has the patient contacted their pharmacy? Yes (Agent: If no, request that the patient contact the pharmacy for the refill. If patient does not wish to contact the pharmacy document the reason why and proceed with request.) (Agent: If yes, when and what did the pharmacy advise?)  This is the patient's preferred pharmacy:  [facility] [phone] [address], [place] - [address] [place] [address] Phone: [phone] Fax: [phone]  Is this the correct pharmacy for this prescription? Yes If no, delete pharmacy and type the correct one.   Has the prescription been filled recently? Yes  Is the patient out of the medication? Yes  Has the patient been seen for an appointment in the last year OR does the patient have an upcoming appointment? Yes  Can we respond through MyChart? Yes  Agent: Please be advised that Rx refills may take up to 3 business days. We ask that you follow-up with your pharmacy.

## 2023-12-06 NOTE — Telephone Encounter (Signed)
 Requested medication (s) are due for refill today: yes  Requested medication (s) are on the active medication list: yes  Last refill:  05/31/23  Future visit scheduled: no  Notes to clinic:  provider no longer at this practice, routing for approval     Requested Prescriptions  Pending Prescriptions Disp Refills   amLODipine  (NORVASC ) 5 MG tablet 90 tablet 1    Sig: Take 1 tablet (5 mg total) by mouth daily.     Cardiovascular: Calcium Channel Blockers 2 Passed - 12/06/2023  3:27 PM      Passed - Last BP in normal range    BP Readings from Last 1 Encounters:  10/06/23 118/78         Passed - Last Heart Rate in normal range    Pulse Readings from Last 1 Encounters:  10/06/23 60         Passed - Valid encounter within last 6 months    Recent Outpatient Visits           2 months ago Closed nondisplaced fracture of lateral malleolus of left fibula with routine healing, subsequent encounter   [facility], Selinda PARAS, MD   3 months ago Closed nondisplaced fracture of lateral malleolus of left fibula with routine healing, subsequent encounter   [place] [facility], Selinda PARAS, MD   4 months ago Closed fracture of left ankle, initial encounter   [facility], Selinda PARAS, MD   4 months ago Sprain of tibiofibular ligament of left ankle, initial encounter   [place] [facility], MD               valsartan -hydrochlorothiazide  (DIOVAN -HCT) 160-25 MG tablet 90 tablet 0    Sig: Take 1 tablet by mouth daily.     Cardiovascular: ARB + Diuretic Combos Failed - 12/06/2023  3:27 PM      Failed - K in normal range and within 180 days    Potassium  Date Value Ref Range Status  12/09/2022 3.9 3.5 - 5.2 mmol/L Final         Failed - Na in normal range and within 180  days    Sodium  Date Value Ref Range Status  12/09/2022 138 134 - 144 mmol/L Final         Failed - Cr in normal range and within 180 days    Creatinine, Ser  Date Value Ref Range Status  12/09/2022 0.84 0.76 - 1.27 mg/dL Final         Failed - eGFR is 10 or above and within 180 days    GFR calc Af Amer  Date Value Ref Range Status  06/23/2020 90 >59 mL/min/1.73 Final    Comment:    **In accordance with recommendations from the NKF-ASN Task force,**   [facility] is in the process of updating its eGFR calculation to the   2021 CKD-EPI creatinine equation that estimates kidney function   without a race variable.    GFR, Estimated  Date Value Ref Range Status  07/21/2020 >60 >60 mL/min Final    Comment:    (NOTE) Calculated using the CKD-EPI Creatinine Equation (2021)    eGFR  Date Value Ref Range Status  12/09/2022 96 >59 mL/min/1.73 Final         Passed - Patient  is not pregnant      Passed - Last BP in normal range    BP Readings from Last 1 Encounters:  10/06/23 118/78         Passed - Valid encounter within last 6 months    Recent Outpatient Visits           2 months ago Closed nondisplaced fracture of lateral malleolus of left fibula with routine healing, subsequent encounter   [place] [facility], Selinda PARAS, MD   3 months ago Closed nondisplaced fracture of lateral malleolus of left fibula with routine healing, subsequent encounter   [place] [facility], Selinda PARAS, MD   4 months ago Closed fracture of left ankle, initial encounter   [facility], Selinda PARAS, MD   4 months ago Sprain of tibiofibular ligament of left ankle, initial encounter   [place] [facility], MD               doxazosin  (CARDURA ) 1 MG tablet 90 tablet 1    Sig: Take 1  tablet (1 mg total) by mouth daily.     Cardiovascular:  Alpha Blockers Passed - 12/06/2023  3:27 PM      Passed - Last BP in normal range    BP Readings from Last 1 Encounters:  10/06/23 118/78         Passed - Valid encounter within last 6 months    Recent Outpatient Visits           2 months ago Closed nondisplaced fracture of lateral malleolus of left fibula with routine healing, subsequent encounter   [place] [facility], Selinda PARAS, MD   3 months ago Closed nondisplaced fracture of lateral malleolus of left fibula with routine healing, subsequent encounter   [place] [facility], Selinda PARAS, MD   4 months ago Closed fracture of left ankle, initial encounter   [facility], Selinda PARAS, MD   4 months ago Sprain of tibiofibular ligament of left ankle, initial encounter   [facility] Joshua Cathryne JAYSON, MD

## 2024-06-07 ENCOUNTER — Ambulatory Visit: Admitting: Student

## 2024-06-07 ENCOUNTER — Encounter: Payer: Self-pay | Admitting: Student

## 2024-06-07 VITALS — BP 138/84 | HR 67 | Ht 67.0 in | Wt 184.0 lb

## 2024-06-07 DIAGNOSIS — J0110 Acute frontal sinusitis, unspecified: Secondary | ICD-10-CM | POA: Diagnosis not present

## 2024-06-07 DIAGNOSIS — I10 Essential (primary) hypertension: Secondary | ICD-10-CM

## 2024-06-07 MED ORDER — VALSARTAN-HYDROCHLOROTHIAZIDE 160-25 MG PO TABS: 1.0000 | ORAL_TABLET | Freq: Every day | ORAL | 0 refills | Status: AC

## 2024-06-07 MED ORDER — AMOXICILLIN-POT CLAVULANATE 875-125 MG PO TABS: 1.0000 | ORAL_TABLET | Freq: Two times a day (BID) | ORAL | 0 refills | Status: AC

## 2024-06-07 NOTE — Assessment & Plan Note (Addendum)
 BP normotensive, running out of valsartan -hydrochlorothiazide  160-25 mg daily. Also taking amlodipine  5 mg. Has not established with new provider since Dr. Joshua retired.  Continue current medications, will set him up for transfer of care appointment.

## 2024-06-07 NOTE — Progress Notes (Signed)
 "  Established Patient Office Visit  Subjective   Patient ID: Jason [name], male    DOB: [birth year]  Age: 69 y.o. MRN: [id number]  Chief Complaint  Patient presents with   Sinusitis    Burning sensation in sinuses. Sneezing and drainage x 1 week. Patient has taken OTC medication but continues to have symptoms.    Jason [name] is a 69 y.o. person with medical hx listed below who presents today for sinus pain for 1 week. Describes as a burning sensation. Started as pressure between the eyes, slight headache, then progress to sinus drainage and some sneezing. Using oxymetolazone spray and OTC antihistamine with some improvement but symptoms return after medication wears off. Some pink tinged mucous when he blow his nose.  He denies fever, chest pain, sore throat, shortness of breath, or ear ringing.    Patient Active Problem List   Diagnosis Date Noted   Closed nondisplaced fracture of lateral malleolus of left fibula 08/26/2023   Closed fracture of left ankle 07/20/2023   Colon cancer screening    Non-recurrent bilateral inguinal hernia without obstruction or gangrene    Glaucoma 07/18/2020   Age-related cataract of both eyes 07/18/2020   Essential hypertension 05/25/2017   Umbilical hernia without obstruction and without gangrene 05/25/2017   Exertional chest pain 05/25/2017   Cervical radiculopathy 05/25/2017   FH: diabetes mellitus 05/25/2017      ROS Refer to HPI    Objective:     Outpatient Encounter Medications as of 06/07/2024  Medication Sig   acetaminophen  (TYLENOL ) 500 MG tablet Take 2 tablets (1,000 mg total) by mouth every 6 (six) hours as needed for mild pain.   albuterol  (VENTOLIN  HFA) 108 (90 Base) MCG/ACT inhaler Inhale 2 puffs into the lungs every 6 (six) hours as needed for wheezing or shortness of breath.   amLODipine  (NORVASC ) 5 MG tablet Take 1 tablet (5 mg total) by mouth daily.   amoxicillin -clavulanate (AUGMENTIN ) 875-125 MG tablet Take 1 tablet by  mouth 2 (two) times daily for 5 days.   Ascorbic Acid (VITAMIN C) 1000 MG tablet Take 1,000 mg by mouth daily.   budesonide -formoterol  (SYMBICORT ) 80-4.5 MCG/ACT inhaler Inhale 2 puffs into the lungs twice a day   cholecalciferol (VITAMIN D3) 25 MCG (1000 UNIT) tablet Take 1,000 Units by mouth daily.   Cod Liver Oil 1000 MG CAPS Take 1,000 mg by mouth daily.   dorzolamide-timolol (COSOPT) 2-0.5 % ophthalmic solution Administer 1 drop to both eyes two (2) times a day.   doxazosin  (CARDURA ) 1 MG tablet Take 1 tablet (1 mg total) by mouth daily.   ibuprofen  (ADVIL ) 600 MG tablet Take 1 tablet (600 mg total) by mouth every 8 (eight) hours as needed for moderate pain.   Misc Natural Products (HORNY GOAT WEED PO) Take by mouth.   Multiple Vitamins-Minerals (MULTIVITAMIN MEN PO) Take by mouth. Mega Man   [DISCONTINUED] valsartan -hydrochlorothiazide  (DIOVAN -HCT) 160-25 MG tablet TAKE 1 TABLET BY MOUTH EVERY DAY   valsartan -hydrochlorothiazide  (DIOVAN -HCT) 160-25 MG tablet Take 1 tablet by mouth daily.   No facility-administered encounter medications on file as of 06/07/2024.    BP 138/84   Pulse 67   Ht 5' 7 (1.702 m)   Wt 184 lb (83.5 kg)   SpO2 97%   BMI 28.82 kg/m  BP Readings from Last 3 Encounters:  06/07/24 138/84  10/06/23 118/78  08/26/23 130/80    Physical Exam Constitutional:      Appearance: Normal appearance.  HENT:  Head: Normocephalic and atraumatic.     Right Ear: Tympanic membrane normal.     Left Ear: Tympanic membrane normal.     Nose: Mucosal edema (mucosal irritation and small amount of mucosal bleeding bilaterrally), congestion and rhinorrhea present.     Right Sinus: Frontal sinus tenderness present.     Left Sinus: Frontal sinus tenderness present.     Mouth/Throat:     Mouth: Mucous membranes are moist.     Pharynx: Oropharynx is clear.  Cardiovascular:     Rate and Rhythm: Normal rate and regular rhythm.  Pulmonary:     Effort: Pulmonary effort is  normal.     Breath sounds: No rhonchi or rales.  Abdominal:     General: Abdomen is flat. Bowel sounds are normal. There is no distension.     Palpations: Abdomen is soft.     Tenderness: There is no abdominal tenderness.  Musculoskeletal:        General: Normal range of motion.     Right lower leg: No edema.     Left lower leg: No edema.  Skin:    General: Skin is warm and dry.     Capillary Refill: Capillary refill takes less than 2 seconds.  Neurological:     General: No focal deficit present.     Mental Status: He is alert and oriented to person, place, and time.  Psychiatric:        Mood and Affect: Mood normal.        Behavior: Behavior normal.        06/07/2024    7:55 AM 05/31/2023    1:48 PM 09/13/2022    8:00 AM  Depression screen PHQ 2/9  Decreased Interest 0 0 0  Down, Depressed, Hopeless 0 0 0  PHQ - 2 Score 0 0 0  Altered sleeping  0 0  Tired, decreased energy  0 0  Change in appetite  0 0  Feeling bad or failure about yourself   0 0  Trouble concentrating  0 0  Moving slowly or fidgety/restless  0 0  Suicidal thoughts  0 0  PHQ-9 Score  0  0   Difficult doing work/chores  Not difficult at all Not difficult at all     Data saved with a previous flowsheet row definition       05/31/2023    1:48 PM 09/13/2022    8:00 AM 09/06/2022    9:50 AM 07/06/2022   10:37 AM  GAD 7 : Generalized Anxiety Score  Nervous, Anxious, on Edge 0  0  0  0   Control/stop worrying 0  0  0  0   Worry too much - different things 0  0  0  0   Trouble relaxing 0  0  0  0   Restless 0  0  0  0   Easily annoyed or irritable 0  0  0  0   Afraid - awful might happen 0  0  0  0   Total GAD 7 Score 0 0 0 0  Anxiety Difficulty Not difficult at all Not difficult at all Not difficult at all Not difficult at all     Data saved with a previous flowsheet row definition    No results found for any visits on 06/07/24.  Last CBC Lab Results  Component Value Date   WBC 4.8 09/01/2021    HGB 15.1 09/01/2021   HCT 44.6 09/01/2021   MCV  92 09/01/2021   MCH 31.1 09/01/2021   RDW 10.7 (L) 09/01/2021   PLT 209 09/01/2021   Last metabolic panel Lab Results  Component Value Date   GLUCOSE 95 12/09/2022   NA 138 12/09/2022   K 3.9 12/09/2022   CL 100 12/09/2022   CO2 23 12/09/2022   BUN 14 12/09/2022   CREATININE 0.84 12/09/2022   EGFR 96 12/09/2022   CALCIUM 9.2 12/09/2022   PHOS 3.1 06/23/2020   PROT 7.0 05/27/2022   ALBUMIN 4.5 05/27/2022   LABGLOB 2.5 05/27/2022   AGRATIO 1.8 05/27/2022   BILITOT 0.6 05/27/2022   ALKPHOS 73 05/27/2022   AST 21 05/27/2022   ALT 34 05/27/2022   ANIONGAP 7 07/21/2020   Last lipids Lab Results  Component Value Date   CHOL 145 05/27/2022   HDL 38 (L) 05/27/2022   LDLCALC 93 05/27/2022   TRIG 73 05/27/2022   CHOLHDL 3.5 05/25/2017   Last hemoglobin A1c Lab Results  Component Value Date   HGBA1C 4.8 05/27/2022      The 10-year ASCVD risk score (Arnett DK, et al., 2019) is: 20%    Assessment & Plan:  Acute non-recurrent frontal sinusitis Having 1 week of symptoms that are gradually improving, viral vs bacterial etiology. Discussed stopping oxymetazoline spray and switch to nasal saline. Will send in augment which he will pick up if symptoms are not improving by the weekend.   Essential hypertension Assessment & Plan: BP normotensive, running out of valsartan -hydrochlorothiazide  160-25 mg daily. Also taking amlodipine  5 mg. Has not established with new provider since Dr. Joshua retired.  Continue current medications, will set him up for transfer of care appointment.   Orders: -     Valsartan -hydroCHLOROthiazide ; Take 1 tablet by mouth daily.  Dispense: 60 tablet; Refill: 0  Other orders -     Amoxicillin -Pot Clavulanate; Take 1 tablet by mouth 2 (two) times daily for 5 days.  Dispense: 10 tablet; Refill: 0      Harlene Saddler, MD "

## 2024-07-02 ENCOUNTER — Encounter: Admitting: Family Medicine
